# Patient Record
Sex: Female | Born: 1990 | Race: White | Hispanic: No | Marital: Single | Smoking: Former smoker
Health system: Southern US, Community
[De-identification: ages and names within clinical notes are randomized; demographics above are authoritative.]

## PROBLEM LIST (undated history)

## (undated) DIAGNOSIS — R87619 Unspecified abnormal cytological findings in specimens from cervix uteri: Secondary | ICD-10-CM

## (undated) DIAGNOSIS — O26619 Liver and biliary tract disorders in pregnancy, unspecified trimester: Secondary | ICD-10-CM

## (undated) DIAGNOSIS — O2390 Unspecified genitourinary tract infection in pregnancy, unspecified trimester: Secondary | ICD-10-CM

## (undated) DIAGNOSIS — IMO0002 Reserved for concepts with insufficient information to code with codable children: Secondary | ICD-10-CM

## (undated) DIAGNOSIS — O26899 Other specified pregnancy related conditions, unspecified trimester: Secondary | ICD-10-CM

## (undated) DIAGNOSIS — Z8619 Personal history of other infectious and parasitic diseases: Secondary | ICD-10-CM

## (undated) DIAGNOSIS — O358XX0 Maternal care for other (suspected) fetal abnormality and damage, not applicable or unspecified: Secondary | ICD-10-CM

## (undated) HISTORY — DX: Maternal care for other (suspected) fetal abnormality and damage, not applicable or unspecified: O35.8XX0

## (undated) HISTORY — PX: NO PAST SURGERIES: SHX2092

## (undated) HISTORY — DX: Personal history of other infectious and parasitic diseases: Z86.19

## (undated) HISTORY — DX: Reserved for concepts with insufficient information to code with codable children: IMO0002

## (undated) HISTORY — DX: Unspecified genitourinary tract infection in pregnancy, unspecified trimester: O23.90

## (undated) HISTORY — DX: Other specified pregnancy related conditions, unspecified trimester: O26.899

## (undated) HISTORY — DX: Liver and biliary tract disorders in pregnancy, unspecified trimester: O26.619

## (undated) HISTORY — DX: Unspecified abnormal cytological findings in specimens from cervix uteri: R87.619

---
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
## 2010-05-08 ENCOUNTER — Other Ambulatory Visit: Payer: Self-pay | Admitting: Family Medicine

## 2010-05-09 ENCOUNTER — Ambulatory Visit
Admission: RE | Admit: 2010-05-09 | Discharge: 2010-05-09 | Disposition: A | Discharge status: Home / Self Care | Payer: Federal, State, Local not specified - PPO | Source: Ambulatory Visit | Attending: Family Medicine | Admitting: Family Medicine

## 2011-07-21 ENCOUNTER — Encounter (HOSPITAL_COMMUNITY): Payer: Self-pay | Admitting: *Deleted

## 2011-07-21 ENCOUNTER — Emergency Department (HOSPITAL_COMMUNITY)
Admission: EM | Admit: 2011-07-21 | Discharge: 2011-07-21 | Disposition: A | Discharge status: Home / Self Care | Payer: Federal, State, Local not specified - PPO | Source: Home / Self Care | Attending: Family Medicine | Admitting: Family Medicine

## 2011-07-21 DIAGNOSIS — J020 Streptococcal pharyngitis: Secondary | ICD-10-CM

## 2011-07-21 LAB — POCT RAPID STREP A: Streptococcus, Group A Screen (Direct): POSITIVE — AB

## 2011-07-21 MED ORDER — IBUPROFEN 600 MG PO TABS: 600.0000 mg | ORAL_TABLET | Freq: Three times a day (TID) | ORAL | Status: AC | PRN

## 2011-07-21 MED ORDER — HYDROCODONE-ACETAMINOPHEN 7.5-325 MG/15ML PO SOLN: 15.0000 mL | Freq: Four times a day (QID) | ORAL | Status: AC | PRN

## 2011-07-21 MED ORDER — PENICILLIN G BENZATHINE 1200000 UNIT/2ML IM SUSP
1.2000 10*6.[IU] | Freq: Once | INTRAMUSCULAR | Status: AC
  Administered 2011-07-21: 1.2 10*6.[IU] via INTRAMUSCULAR

## 2011-07-21 MED ORDER — PENICILLIN G BENZATHINE 1200000 UNIT/2ML IM SUSP
INTRAMUSCULAR | Status: AC
  Filled 2011-07-21: qty 2

## 2011-07-21 NOTE — Discharge Instructions (Signed)

## 2011-07-21 NOTE — ED Provider Notes (Signed)
History     CSN: [id number]  Arrival date & time 07/21/11  1703   First MD Initiated Contact with Patient 07/21/11 1749      Chief Complaint  Patient presents with  . Sore Throat    (Consider location/radiation/quality/duration/timing/severity/associated sxs/prior treatment) HPI Comments: 21 year old female smoker otherwise no significant past medical history. Here complaining of sore throat headache and body aches since yesterday. Pain with swallowing but able to eat solids and drink fluids. Has had chills and subjective fever. Last time she took NyQuil was 6 hours ago. No abdominal pain nausea vomiting or diarrhea. No skin rash.   History reviewed. No pertinent past medical history.  History reviewed. No pertinent past surgical history.  History reviewed. No pertinent family history.  History  Substance Use Topics  . Smoking status: Current Everyday Smoker  . Smokeless tobacco: Not on file  . Alcohol Use: Yes    OB History    Grav Para Term Preterm Abortions TAB SAB Ect Mult Living                  Review of Systems  Constitutional: Positive for fever and chills. Negative for fatigue.  HENT: Positive for sore throat. Negative for rhinorrhea, sneezing, neck pain and neck stiffness.   Respiratory: Negative for cough, shortness of breath and wheezing.   Gastrointestinal: Negative for nausea, vomiting, abdominal pain and diarrhea.  Genitourinary: Negative for dysuria, hematuria and flank pain.  Musculoskeletal: Positive for myalgias.  Skin: Negative for rash.  Neurological: Positive for headaches.    Allergies  Review of patient's allergies indicates no known allergies.  Home Medications   Current Outpatient Rx  Name Route Sig Dispense Refill  . HYDROCODONE-ACETAMINOPHEN 7.5-325 MG/15ML PO SOLN Oral Take 15 mLs by mouth every 6 (six) hours as needed for pain. 120 mL 0  . IBUPROFEN 600 MG PO TABS Oral Take 1 tablet (600 mg total) by mouth every 8 (eight) hours as  needed for pain or fever. 20 tablet 0    BP 113/61  Pulse 115  Temp 99.6 F (37.6 C) (Oral)  Resp 20  SpO2 100%  LMP 07/03/2011  Physical Exam  Nursing note and vitals reviewed. Constitutional: She is oriented to person, place, and time. She appears well-developed and well-nourished. No distress.  HENT:  Head: Normocephalic and atraumatic.       Nasal Congestion with erythema and swelling of nasal turbinates, clear rhinorrhea. Significant pharyngeal erythema no exudates. No uvula deviation. No trismus. TM's with increased vascular markings and some dullness bilaterally no swelling or bulging   Eyes: Conjunctivae and EOM are normal. Pupils are equal, round, and reactive to light. No scleral icterus.  Neck: Normal range of motion. Neck supple.  Cardiovascular: Normal rate, regular rhythm and normal heart sounds.   Pulmonary/Chest: Effort normal and breath sounds normal. No respiratory distress. She has no wheezes. She has no rales.  Abdominal: Soft. There is no tenderness.       No hepatosplenomegaly.   Lymphadenopathy:    She has cervical adenopathy.  Neurological: She is alert and oriented to person, place, and time.  Skin: No rash noted.    ED Course  Procedures (including critical care time)  Labs Reviewed  POCT RAPID STREP A ([facility] CARE ONLY) - Abnormal; Notable for the following:    Streptococcus, Group A Screen (Direct) POSITIVE (*)     All other components within normal limits   No results found.   1. Strep pharyngitis  MDM  Positive rapid strep test. Treated with Bicillin 1.2 million IM x1. Prescribe ibuprofen, hydrocodone with acetaminophen when necessary for pain or fever. Supportive care instructions provided as well Asked to return if worsening or persistent symptoms despite following treatment.        Sharin Grave, MD 07/22/11 1420

## 2011-07-21 NOTE — ED Notes (Signed)
POCT: UA & UPREG - both never actually run. Blase Mess, RT incidently clicked on/completed the orders, assigning this Clinical research associate, as the Research scientist (medical). The tests were cancelled by Alfonse Ras, MD - she placed the orders in error.

## 2011-07-21 NOTE — ED Notes (Signed)
Pt  Reports  Symptoms  Of  sorethroat  Pain  When he she  Swallows  As  Well  As  Some  Body  Aches  As  Well      Pt  Reports  The  Symptoms  Started  Yesterday

## 2011-08-14 ENCOUNTER — Ambulatory Visit
Admission: RE | Admit: 2011-08-14 | Discharge: 2011-08-14 | Disposition: A | Discharge status: Home / Self Care | Payer: Federal, State, Local not specified - PPO | Source: Ambulatory Visit | Attending: *Deleted | Admitting: *Deleted

## 2011-08-14 ENCOUNTER — Other Ambulatory Visit: Payer: Self-pay | Admitting: *Deleted

## 2011-08-14 DIAGNOSIS — W19XXXA Unspecified fall, initial encounter: Secondary | ICD-10-CM

## 2012-01-27 NOTE — L&D Delivery Note (Signed)
  Delivery Note  First Stage:  Induction with cervical balloon - placed @ 0800 Labor onset: 1324 with AROM Augmentation : AROM @ 1324  With clear fluid Analgesia /Anesthesia intrapartum: none  Labored in tub for majority of labor / movement in and out of tub per patient discretion FHR category 1 throughout labor  Second Stage: Exited tub 30 minutes around 1700 per patient discretion Complete dilation at 1732 Onset of pushing at 1735 FHR second stage category 1  Delivery of a viable female at 53 by CNM in LOA position no nuchal cord Cord double clamped after cessation of pulsation, cut by FOB Cord blood sample collected   Third Stage: Placenta delivered [facility] intact with 3 VC @ 1807 Placenta disposition: for disposal Uterine tone firm / bleeding brisk - identified bleeding large vaginal vessel  2nd laceration identified  Anesthesia for repair: 1% xylocaine Repair 3-0 vicryl for hemostasis to bleeding vessel / deep perineal muscle repair with 3-0 vicryl  / 4-0 subcuticular closure Est. Blood Loss (mL):  Complications: none  Mom to postpartum.  Baby to Mom for bonding.  Newborn: Birth Weight: 7 pounds 11 ounces Apgar Scores: 9 - 9 Feeding planned: breast  Marlinda Mike CNM, MSN, [facility] 09/17/2012, 6:31 PM

## 2012-03-15 LAB — OB RESULTS CONSOLE RUBELLA ANTIBODY, IGM: Rubella: IMMUNE

## 2012-03-15 LAB — OB RESULTS CONSOLE GC/CHLAMYDIA
Chlamydia: NEGATIVE
Gonorrhea: NEGATIVE

## 2012-03-15 LAB — OB RESULTS CONSOLE HEPATITIS B SURFACE ANTIGEN: Hepatitis B Surface Ag: NEGATIVE

## 2012-03-15 LAB — OB RESULTS CONSOLE RPR: RPR: NONREACTIVE

## 2012-03-15 LAB — OB RESULTS CONSOLE ABO/RH: RH Type: POSITIVE

## 2012-03-15 LAB — OB RESULTS CONSOLE ANTIBODY SCREEN: Antibody Screen: NEGATIVE

## 2012-03-15 LAB — OB RESULTS CONSOLE HIV ANTIBODY (ROUTINE TESTING): HIV: NONREACTIVE

## 2012-08-01 IMAGING — CR DG LUMBAR SPINE COMPLETE 4+V
5 series · 5 of 5 positions shown · non-contrast
Comparison: None.

CLINICAL DATA: Fell several days ago with pain in the lower back

LUMBAR SPINE - COMPLETE 4+ VIEW

[t l-spine a.p.]
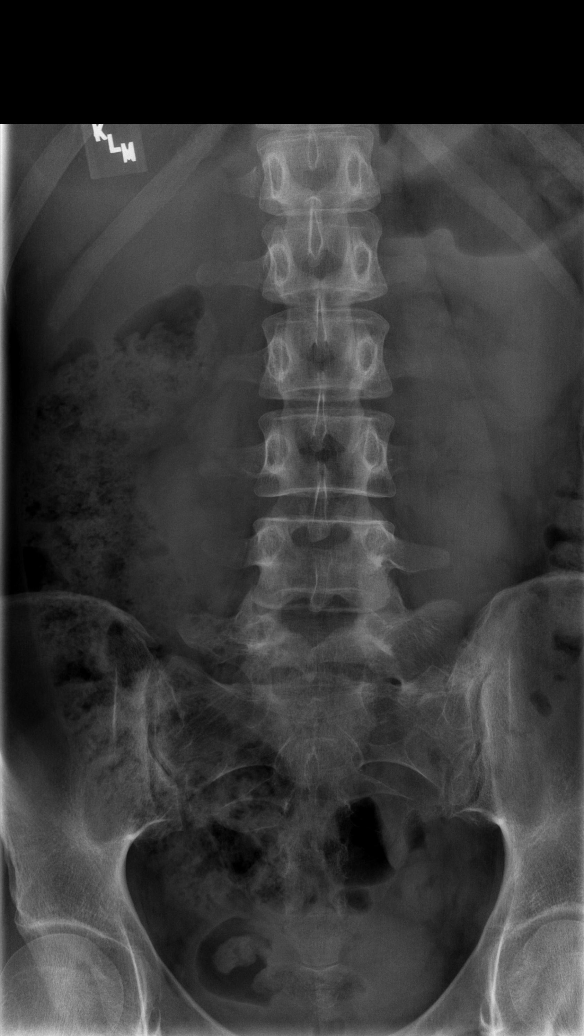

[t l-spine oblique exposure (1 of 2)]
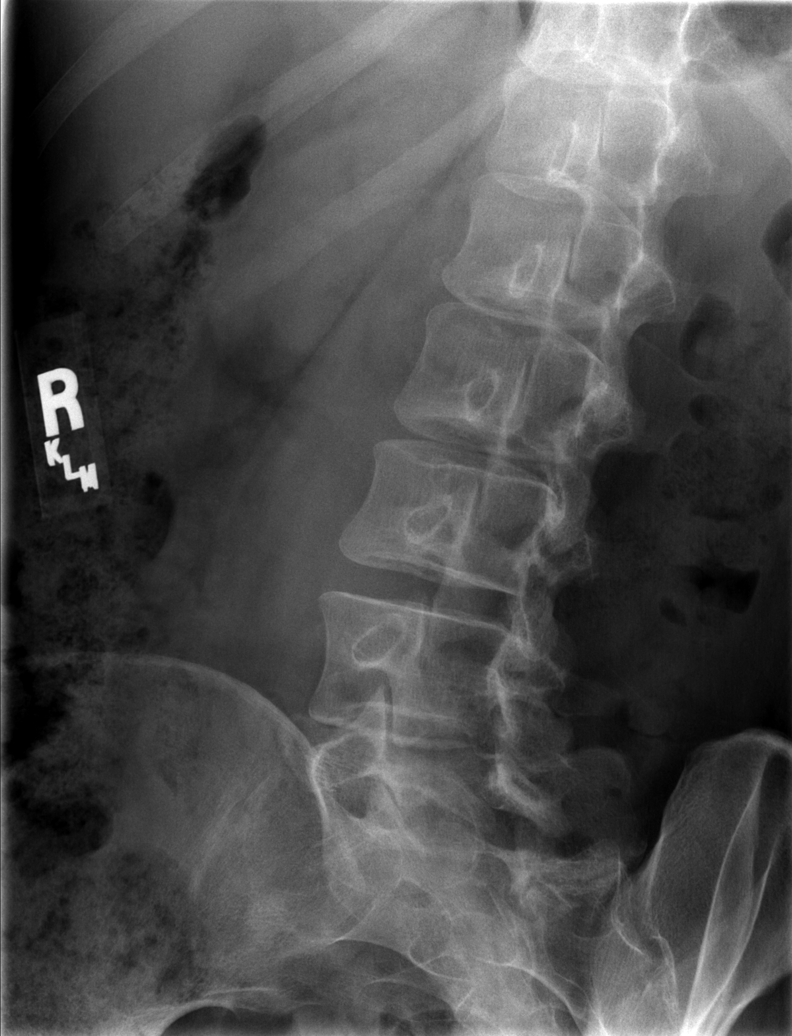

[t l-spine oblique exposure (2 of 2)]
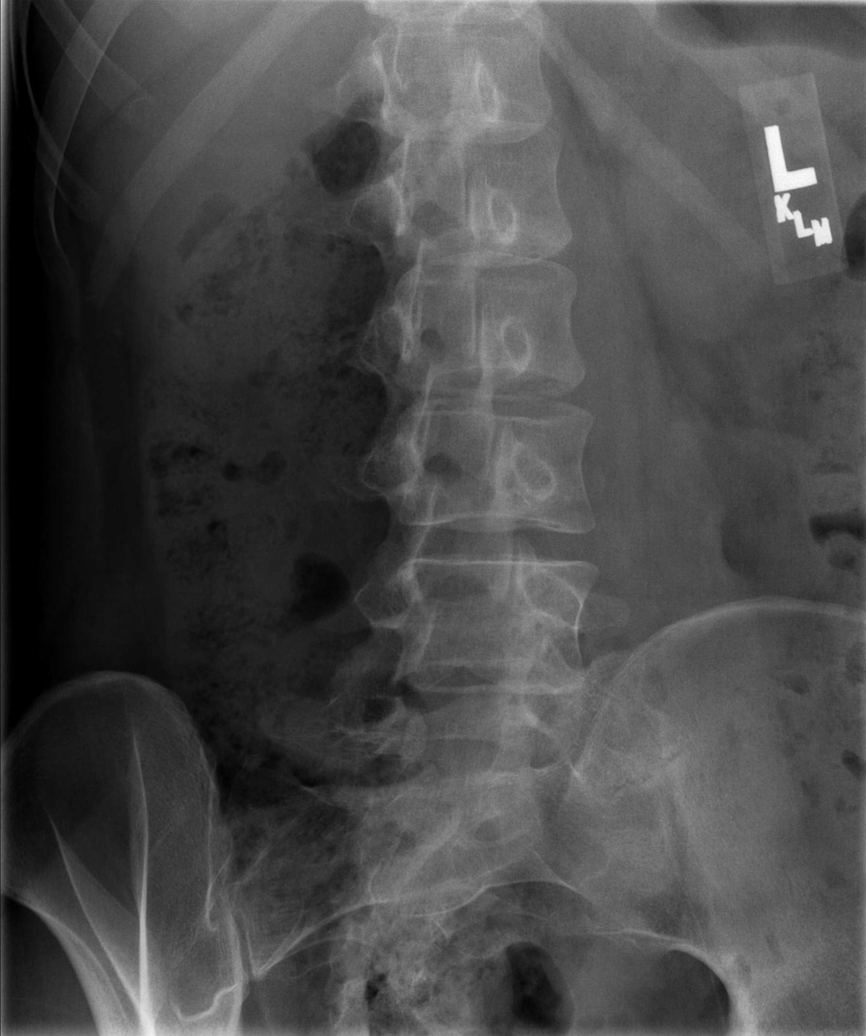

[t l-spine lat]
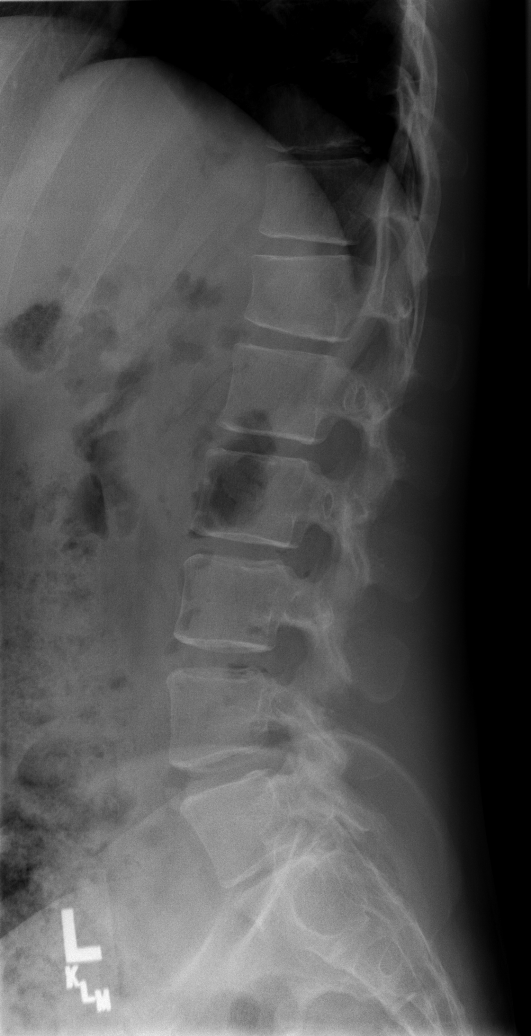

[t l-spine l5-s1 spot]
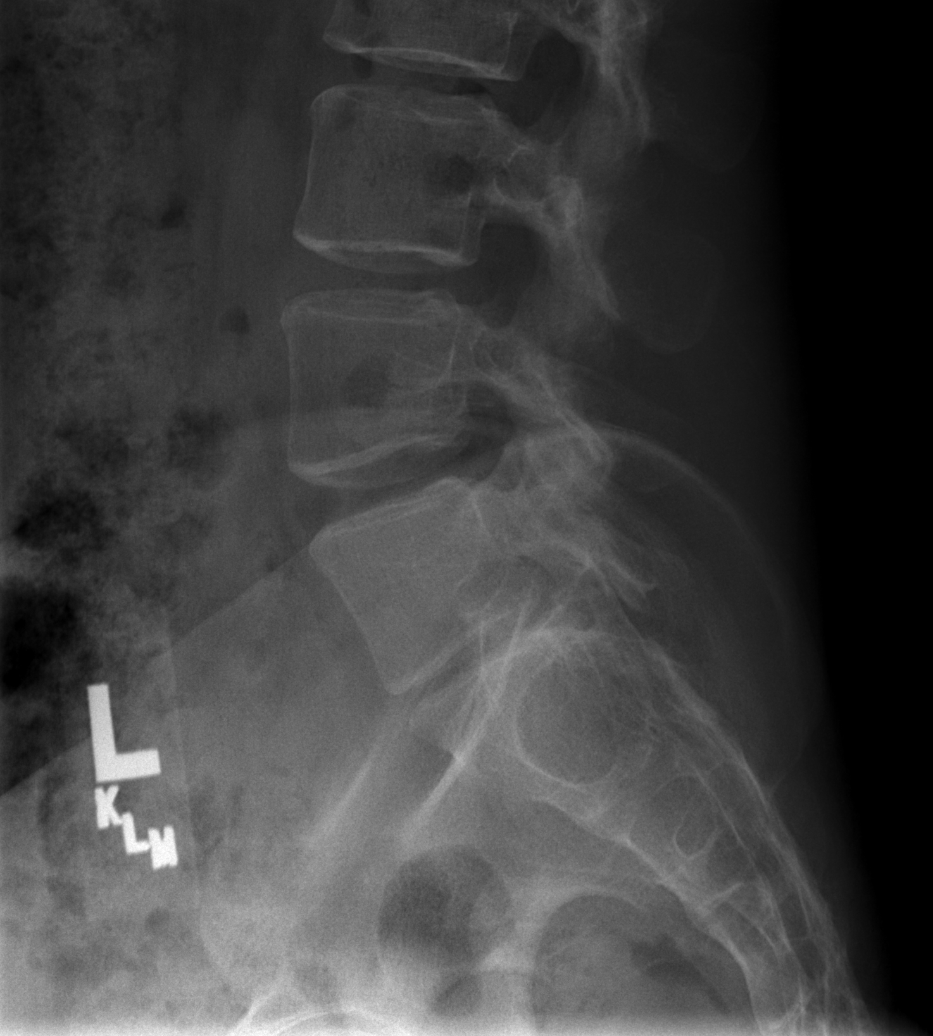

[5 of 5 positions shown; findings below may reference images not displayed]

FINDINGS: The lumbar vertebrae are in normal alignment.  There
appear to be sixth non-rib bearing lumbar vertebrae.
Intervertebral disc spaces appear normal.  There is very minimal
anterior wedging of the anterosuperior aspect of L1 vertebral body
which could represent very minimal compression deformity. No
retropulsion is seen.  The SI joints appear normal.
IMPRESSION: Six non-rib bearing lumbar vertebrae.  Question of mild anterior
wedge compression of L1.

## 2012-08-26 LAB — OB RESULTS CONSOLE GBS: GBS: NEGATIVE

## 2012-09-15 ENCOUNTER — Telehealth (HOSPITAL_COMMUNITY): Payer: Self-pay | Admitting: *Deleted

## 2012-09-15 ENCOUNTER — Encounter (HOSPITAL_COMMUNITY): Payer: Self-pay | Admitting: *Deleted

## 2012-09-15 NOTE — Telephone Encounter (Signed)
Preadmission screen  

## 2012-09-17 ENCOUNTER — Encounter (HOSPITAL_COMMUNITY): Payer: Self-pay

## 2012-09-17 ENCOUNTER — Inpatient Hospital Stay (HOSPITAL_COMMUNITY)
Admission: RE | Admit: 2012-09-17 | Discharge: 2012-09-19 | DRG: 373 | Disposition: A | Discharge status: Home / Self Care | Payer: Federal, State, Local not specified - PPO | Source: Ambulatory Visit | Attending: Obstetrics and Gynecology | Admitting: Obstetrics and Gynecology

## 2012-09-17 VITALS — BP 94/60 | HR 72 | Temp 97.5°F | Resp 18 | Ht 67.0 in | Wt 190.0 lb

## 2012-09-17 DIAGNOSIS — K838 Other specified diseases of biliary tract: Secondary | ICD-10-CM | POA: Diagnosis present

## 2012-09-17 DIAGNOSIS — O26649 Intrahepatic cholestasis of pregnancy, unspecified trimester: Secondary | ICD-10-CM | POA: Diagnosis present

## 2012-09-17 DIAGNOSIS — IMO0002 Reserved for concepts with insufficient information to code with codable children: Secondary | ICD-10-CM | POA: Diagnosis present

## 2012-09-17 DIAGNOSIS — O26619 Liver and biliary tract disorders in pregnancy, unspecified trimester: Secondary | ICD-10-CM | POA: Diagnosis present

## 2012-09-17 DIAGNOSIS — K831 Obstruction of bile duct: Secondary | ICD-10-CM | POA: Diagnosis present

## 2012-09-17 LAB — CBC
HCT: 36.4 % (ref 36.0–46.0)
Hemoglobin: 12.3 g/dL (ref 12.0–15.0)
MCH: 28.1 pg (ref 26.0–34.0)
MCHC: 33.8 g/dL (ref 30.0–36.0)
MCV: 83.3 fL (ref 78.0–100.0)
Platelets: 182 10*3/uL (ref 150–400)
RBC: 4.37 MIL/uL (ref 3.87–5.11)
RDW: 13.7 % (ref 11.5–15.5)
WBC: 14.4 10*3/uL — ABNORMAL HIGH (ref 4.0–10.5)

## 2012-09-17 LAB — COMPREHENSIVE METABOLIC PANEL
ALT: 25 U/L (ref 0–35)
AST: 24 U/L (ref 0–37)
Albumin: 2.9 g/dL — ABNORMAL LOW (ref 3.5–5.2)
Alkaline Phosphatase: 180 U/L — ABNORMAL HIGH (ref 39–117)
BUN: 9 mg/dL (ref 6–23)
CO2: 18 mEq/L — ABNORMAL LOW (ref 19–32)
Calcium: 9.1 mg/dL (ref 8.4–10.5)
Chloride: 102 mEq/L (ref 96–112)
Creatinine, Ser: 0.49 mg/dL — ABNORMAL LOW (ref 0.50–1.10)
GFR calc Af Amer: >90 mL/min (ref >90)
GFR calc non Af Amer: >90 mL/min (ref >90)
Glucose, Bld: 77 mg/dL (ref 70–99)
Potassium: 3.8 mEq/L (ref 3.5–5.1)
Sodium: 134 mEq/L — ABNORMAL LOW (ref 135–145)
Total Bilirubin: 0.3 mg/dL (ref 0.3–1.2)
Total Protein: 7.1 g/dL (ref 6.0–8.3)

## 2012-09-17 LAB — RPR: RPR Ser Ql: NONREACTIVE

## 2012-09-17 MED ORDER — IBUPROFEN 600 MG PO TABS
600.0000 mg | ORAL_TABLET | Freq: Four times a day (QID) | ORAL | Status: DC
  Administered 2012-09-17 – 2012-09-19 (×7): 600 mg via ORAL
  Filled 2012-09-17 (×7): qty 1

## 2012-09-17 MED ORDER — LIDOCAINE HCL (PF) 1 % IJ SOLN
30.0000 mL | INTRAMUSCULAR | Status: DC | PRN
  Administered 2012-09-17: 30 mL via SUBCUTANEOUS
  Filled 2012-09-17 (×2): qty 30

## 2012-09-17 MED ORDER — OXYCODONE-ACETAMINOPHEN 5-325 MG PO TABS
1.0000 | ORAL_TABLET | ORAL | Status: DC | PRN
  Filled 2012-09-17: qty 1

## 2012-09-17 MED ORDER — DIPHENHYDRAMINE HCL 25 MG PO CAPS: 25.0000 mg | ORAL_CAPSULE | Freq: Four times a day (QID) | ORAL | Status: DC | PRN

## 2012-09-17 MED ORDER — URSODIOL 300 MG PO CAPS
300.0000 mg | ORAL_CAPSULE | Freq: Two times a day (BID) | ORAL | Status: DC
  Administered 2012-09-17 – 2012-09-19 (×4): 300 mg via ORAL
  Filled 2012-09-17 (×6): qty 1

## 2012-09-17 MED ORDER — LACTATED RINGERS IV SOLN: 500.0000 mL | INTRAVENOUS | Status: DC | PRN

## 2012-09-17 MED ORDER — SENNOSIDES-DOCUSATE SODIUM 8.6-50 MG PO TABS
2.0000 | ORAL_TABLET | Freq: Every day | ORAL | Status: DC
  Administered 2012-09-17 – 2012-09-18 (×2): 2 via ORAL

## 2012-09-17 MED ORDER — ACETAMINOPHEN 325 MG PO TABS: 650.0000 mg | ORAL_TABLET | ORAL | Status: DC | PRN

## 2012-09-17 MED ORDER — IBUPROFEN 600 MG PO TABS: 600.0000 mg | ORAL_TABLET | Freq: Four times a day (QID) | ORAL | Status: DC | PRN

## 2012-09-17 MED ORDER — OXYTOCIN 10 UNIT/ML IJ SOLN
10.0000 [IU] | Freq: Once | INTRAMUSCULAR | Status: DC
  Filled 2012-09-17: qty 2

## 2012-09-17 MED ORDER — BENZOCAINE-MENTHOL 20-0.5 % EX AERO
1.0000 "application " | INHALATION_SPRAY | CUTANEOUS | Status: DC | PRN
  Filled 2012-09-17: qty 56

## 2012-09-17 MED ORDER — DIBUCAINE 1 % RE OINT
1.0000 "application " | TOPICAL_OINTMENT | RECTAL | Status: DC | PRN
  Filled 2012-09-17: qty 28

## 2012-09-17 MED ORDER — WITCH HAZEL-GLYCERIN EX PADS: 1.0000 "application " | MEDICATED_PAD | CUTANEOUS | Status: DC | PRN

## 2012-09-17 MED ORDER — CITRIC ACID-SODIUM CITRATE 334-500 MG/5ML PO SOLN: 30.0000 mL | ORAL | Status: DC | PRN

## 2012-09-17 MED ORDER — LANOLIN HYDROUS EX OINT: TOPICAL_OINTMENT | CUTANEOUS | Status: DC | PRN

## 2012-09-17 NOTE — H&P (Signed)
  OB ADMISSION/ HISTORY & PHYSICAL:  Admission Date: 09/17/2012  7:06 AM  Admit Diagnosis: cholestasis of pregnancy / marginal cord insertion  Shallyn Constancio is a 22 y.o. female presenting for induction of labor.  Prenatal History: G1P0   EDC : 09/28/2012, by Other Basis  Prenatal care at [facility]  Primary Ob Provider: Marlinda Mike CNM Prenatal course complicated by UTI / scabies x 2 / abnormal pap / pruritus gravidarum / cholestasis of pregnancy (elevated bile acid 12 / LE 32-35)  Prenatal Labs: ABO, Rh: O/Positive/-- (02/18 0000) Antibody: Negative (02/18 0000) Rubella: Immune (02/18 0000)  RPR: Nonreactive (02/18 0000)  HBsAg: Negative (02/18 0000)  HIV: Non-reactive (02/18 0000)  GTT: negative GBS: Negative (08/01 0000)   Medical / Surgical History :  Past medical history:  Past Medical History  Diagnosis Date  . Abnormal Pap smear   . Infections of genitourinary tract in pregnancy, unspecified as to episode of care(646.60)   . Hx of scabies   . Other known or suspected fetal abnormality, not elsewhere classified, affecting management of mother, antepartum condition or complication     fetal echogenic bowel  . Other specified complication of pregnancy, unspecified as to episode of care     pruritis gravidarum  . Liver and biliary tract disorders in pregnancy, unspecified as to episode of care or not applicable(646.70)   . Marginal insertion of umbilical cord      Past surgical history:  Past Surgical History  Procedure Laterality Date  . No past surgeries      Family History: No family history on file.   Social History:  reports that she quit smoking about 8 months ago. She has never used smokeless tobacco. She reports that she does not drink alcohol or use illicit drugs.  Allergies: Milk-related compounds; Sugar-protein-starch; and Wheat bran   Current Medications at time of admission:  Prenatal vitamin Actigal 300 mg TID  Review of  Systems: Active FM irregular ctx x 24 hours  - not painful No LOF bloody show absent  Physical Exam:  VS: Blood pressure 119/75, pulse 91, temperature 99 F (37.2 C), temperature source Oral, resp. rate 16, height 5\' 7"  (1.702 m), weight 86.183 kg (190 lb).  General: alert and oriented, appears comfortable / NAD or pain Heart: RRR Lungs: Clear lung fields Abdomen: Gravid, soft and non-tender, non-distended / uterus: gravid and non-tender Extremities: no edema  Genitalia / VE:  2-3 / 70% / vtx / -2  FHR: baseline rate 135 / variability moderate / accelerations + / no decelerations TOCO: mild ctx every 3-6 minutes  Assessment: 38.[redacted] weeks gestation Induction of labor - cholestasis and marginal cord insertion FHR category 1  Plan:  Admit Cervical balloon to AROM  / pitocin augmentation Unsure pain management plan at this time - wants water immersion   Dr Cherly Hensen notified of admission / plan of care   Marlinda Mike CNM, MSN, [facility] 09/17/2012, 7:56 AM

## 2012-09-17 NOTE — Progress Notes (Signed)
CNM assisted with setting up birth tub

## 2012-09-17 NOTE — Progress Notes (Signed)
S:  Cramping and regular ctx - not painful      Wants to enter tub for laboring      O:  VS: Blood pressure 120/60, pulse 90, temperature 98.1 F (36.7 C), temperature source Oral, resp. rate 16, height 5\' 7"  (1.702 m), weight 86.183 kg (190 lb).        FHR : baseline 135 / variability moderate / accelerations + / no decelerations        Toco: contractions every 2-3 minutes / mild         Cervix : 6 / 90% / vtx / -1 BBOW        Membranes: AROM - clear        Hx scabies reviewed by [facility] manager- ok if no active infection ( dermatology evaluation last week - no active infection)        Assisted with tub set-up / filling        Water temp 100.3  A: active labor     FHR category 1  P: water immersion with continuous EFM     recheck in 2 hours for progression - if no progression pitocin augmentation     Patient BRP then entered tub at 1345    Marlinda Mike CNM, MSN, [facility] 09/17/2012, 1:50 PM

## 2012-09-17 NOTE — Progress Notes (Signed)
S:  Ctx more uncomfortable - some pressure       + nausea / some loose BM ( in and out of tub for past 1.5 hours)       Out of tub for BRP and repeat cervical check  O:  VS: Blood pressure 134/62, pulse 74, temperature 99 F (37.2 C), temperature source Oral, resp. rate 20, height 5\' 7"  (1.702 m), weight 86.183 kg (190 lb).        FHR : baseline 145 / variability moderate / accelerations + / no decelerations        Toco: contractions every 2 minutes / mild-moderate         Cervix : 8 / 90 / vtx / -1 / ROT        Membranes: clear fluid - Small + show  A: active labor     FHR category 1  P: continue current management - anticipate SVB in next 2 hours          Water drained and replaced in tub - temp 100.1 on re-entry @ 1545    Marlinda Mike CNM, MSN, FACNM 09/17/2012, 3:50 PM

## 2012-09-17 NOTE — Progress Notes (Signed)
S:  comfortable  O:  VS: Blood pressure 119/75, pulse 91, temperature 99 F (37.2 C), temperature source Oral, resp. rate 16, height 5\' 7"  (1.702 m), weight 86.183 kg (190 lb).        FHR : baseline 135 / variability moderaet / accelerations + / no decelerations        Toco: contractions every 2-6 minutes / mild         Cervical balloon placed then 60 ml balloon inflated / procedure tolerated well / secured to leg  A: Induction of labor     FHR category 1  P: discussed IOl options - cervical balloon to AROM then pitocin versus pitocin or combination of two options      Placed cervical balloon since already contracting                                - will reassess ctx pattern after balloon out then decide timing for AROM and pitocin     Marlinda Mike CNM, MSN, [facility] 09/17/2012, 8:09 AM

## 2012-09-18 LAB — COMPREHENSIVE METABOLIC PANEL
ALT: 19 U/L (ref 0–35)
AST: 23 U/L (ref 0–37)
Albumin: 2.4 g/dL — ABNORMAL LOW (ref 3.5–5.2)
Alkaline Phosphatase: 153 U/L — ABNORMAL HIGH (ref 39–117)
BUN: 10 mg/dL (ref 6–23)
CO2: 21 mEq/L (ref 19–32)
Calcium: 9.6 mg/dL (ref 8.4–10.5)
Chloride: 104 mEq/L (ref 96–112)
Creatinine, Ser: 0.54 mg/dL (ref 0.50–1.10)
GFR calc Af Amer: >90 mL/min (ref >90)
GFR calc non Af Amer: >90 mL/min (ref >90)
Glucose, Bld: 92 mg/dL (ref 70–99)
Potassium: 3.6 mEq/L (ref 3.5–5.1)
Sodium: 137 mEq/L (ref 135–145)
Total Bilirubin: 0.3 mg/dL (ref 0.3–1.2)
Total Protein: 5.6 g/dL — ABNORMAL LOW (ref 6.0–8.3)

## 2012-09-18 LAB — CBC
HCT: 31.3 % — ABNORMAL LOW (ref 36.0–46.0)
Hemoglobin: 10.3 g/dL — ABNORMAL LOW (ref 12.0–15.0)
MCH: 27.7 pg (ref 26.0–34.0)
MCHC: 32.9 g/dL (ref 30.0–36.0)
MCV: 84.1 fL (ref 78.0–100.0)
Platelets: 201 10*3/uL (ref 150–400)
RBC: 3.72 MIL/uL — ABNORMAL LOW (ref 3.87–5.11)
RDW: 13.8 % (ref 11.5–15.5)
WBC: 17.2 10*3/uL — ABNORMAL HIGH (ref 4.0–10.5)

## 2012-09-18 LAB — ABO/RH: ABO/RH(D): O POS
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
## 2012-09-18 NOTE — Progress Notes (Signed)
PPD 1 SVD  S:  Reports feeling well - no concerns / happy with birth experience             No itching or epigastric pain             Tolerating po/ No nausea or vomiting             Bleeding is light             Pain controlled with motrin and percocet             Up ad lib / ambulatory / voiding QS  Newborn breast feeding   O:               VS: BP 111/66  Pulse 81  Temp(Src) 98 F (36.7 C) (Oral)  Resp 18  Ht 5\' 7"  (1.702 m)  Wt 86.183 kg (190 lb)  BMI 29.75 kg/m2   LABS:              Recent Labs  09/17/12 0834 09/18/12 0639  WBC 14.4* 17.2*  HGB 12.3 10.3*  PLT 182 201               Blood type: --/--/O POS (08/23 0830)  Rubella: Immune (02/18 0000)    CMP stable / within normal limits  ( SGOT 23 / SGPT 19 )                   I&O: Intake/Output     08/23 0701 - 08/24 0700 08/24 0701 - 08/25 0700   Blood 450    Total Output 450     Net -450                       Physical Exam:             Alert and oriented X3  Abdomen: soft, non-tender, non-distended              Fundus: firm, non-tender, U-2  Perineum: moderate edema / ice pack in place  Lochia: light  Extremities: no edema, no calf pain or tenderness    A: PPD # 1              Cholestasis of pregnancy - delivered  Doing well - stable status  P: Routine post partum orders             Actigall x 2 weeks - repeat labs in office 2 weeks    Marlinda Mike CNM, MSN, [facility] 09/18/2012, 9:00 AM

## 2012-09-19 MED ORDER — URSODIOL 300 MG PO CAPS: 300.0000 mg | ORAL_CAPSULE | Freq: Two times a day (BID) | ORAL | Status: DC

## 2012-09-19 MED ORDER — IBUPROFEN 600 MG PO TABS: 600.0000 mg | ORAL_TABLET | Freq: Four times a day (QID) | ORAL | Status: DC

## 2012-09-19 NOTE — Lactation Note (Addendum)
This note was copied from the chart of Stacey Fraida Veldman. Lactation Consultation Note; Follow up visit with mom. She reports that breast feeding is going better. Nipples are still tender but intact. Rubbing EBM into them after nursing. Reports that breasts are feeling fuller this morning. Lab in to draw blood. No questions at present. Encouraged to call for assist if needed before DC.  Patient Name: Stacey Tate [id number] Date: 09/19/2012 Reason for consult: Follow-up assessment   Maternal Data    Feeding Feeding Type: Breast Milk  LATCH Score/Interventions    Lactation Tools Discussed/Used     Consult Status Consult Status: Complete    Pamelia Hoit 09/19/2012, 9:36 AM

## 2012-09-19 NOTE — Discharge Summary (Signed)
Obstetric Discharge Summary Reason for Admission: onset of labor Prenatal Procedures: ultrasound Intrapartum Procedures: spontaneous vaginal delivery Postpartum Procedures: none Complications-Operative and Postpartum: 2nd degree perineal laceration Hemoglobin  Date Value Range Status  09/18/2012 10.3* 12.0 - 15.0 g/dL Final     HCT  Date Value Range Status  09/18/2012 31.3* 36.0 - 46.0 % Final    Physical Exam:  General: alert, cooperative and no distress Lochia: appropriate Uterine Fundus: firm, midline, U-2 DVT Evaluation: No evidence of DVT seen on physical exam. Negative Homan's sign. No cords or calf tenderness. No significant calf/ankle edema.  Labor Course: spontaneous onset of labor / labored in and out of tub for majority of labor / augmentation with cervical balloon / progressed to complete without further intervention / SVD with vaginal vessel laceration and 2nd degree laceration - hemastatic with repair / baby skin-to-skin with mom  Discharge Diagnoses: Term Pregnancy-delivered and Cholestasis of Pregnancy - delivered  Discharge Information: Date: 09/19/2012 Activity: pelvic rest Diet: routine Medications: Tylenol #3, Ibuprofen and Actigall Condition: stable Instructions: refer to practice specific booklet Discharge to: home Follow-up Information   Follow up with Marlinda Mike, CNM. Schedule an appointment as soon as possible for a visit in 6 weeks. (Return to [facility] in 2 weeks for repeat lab work)    Specialty:  Obstetrics and Gynecology   Contact information:   [address] [place] [address] [phone]       Newborn Data: Live born female  Birth Weight: 7 lb 11.3 oz (3496 g) APGAR: 9, 9  Home with mother.  Kenard Gower, MSN, CNM 09/19/2012, 11:58 AM

## 2012-09-19 NOTE — Progress Notes (Signed)
Patient ID: Stacey Tate, female   DOB: [birth year], 22 y.o.   MRN: [id number] Post Partum Day #2            Information for the patient's newborn:  Stacey, Blackston Girl Tate [[id number]]  female  Feeding: breast  Subjective: No HA, SOB, CP, F/C, breast symptoms. Pain well-controlled with ibuprofen. Normal vaginal bleeding, no clots.      Objective:  Temp:  [97.5 F (36.4 C)-98.4 F (36.9 C)] 97.5 F (36.4 C) (08/25 0645) Pulse Rate:  [72-76] 72 (08/25 0645) Resp:  [18] 18 (08/25 0645) BP: (94-97)/(58-60) 94/60 mmHg (08/25 0645)   Blood type: O POS (08/23 0830) Rubella: Immune (02/18 0000)    Physical Exam:  General: alert, cooperative and no distress Uterine Fundus: firm, midline, U-2 Lochia: appropriate Perineum: 2nd degree repair healing well, no edema  DVT Evaluation: No evidence of DVT seen on physical exam. Negative Homan's sign. No cords or calf tenderness. No significant calf/ankle edema.    Assessment/Plan: PPD # 2 / 22 y.o., G1P1001 S/P: spontaneous vaginal   Active Problems:   Cholestasis of pregnancy   Marginal insertion of umbilical cord   Postpartum care following vaginal delivery (8/23)   Normal postpartum exam  Continue current postpartum care  Ibuprofen prn while at home  Actigall x 2 weeks - repeat liver enzymes in 2 weeks  D/C home   LOS: 2 days   Stacey Tate, M, MSN, CNM 09/19/2012, 11:48 AM

## 2013-11-27 ENCOUNTER — Encounter (HOSPITAL_COMMUNITY): Payer: Self-pay

## 2015-06-07 DIAGNOSIS — Z113 Encounter for screening for infections with a predominantly sexual mode of transmission: Secondary | ICD-10-CM | POA: Diagnosis not present

## 2015-06-07 DIAGNOSIS — Z6824 Body mass index (BMI) 24.0-24.9, adult: Secondary | ICD-10-CM | POA: Diagnosis not present

## 2015-06-07 DIAGNOSIS — R87612 Low grade squamous intraepithelial lesion on cytologic smear of cervix (LGSIL): Secondary | ICD-10-CM | POA: Diagnosis not present

## 2015-06-07 DIAGNOSIS — Z01419 Encounter for gynecological examination (general) (routine) without abnormal findings: Secondary | ICD-10-CM | POA: Diagnosis not present

## 2015-11-15 DIAGNOSIS — F411 Generalized anxiety disorder: Secondary | ICD-10-CM | POA: Diagnosis not present

## 2015-11-21 DIAGNOSIS — F411 Generalized anxiety disorder: Secondary | ICD-10-CM | POA: Diagnosis not present

## 2015-12-02 DIAGNOSIS — F411 Generalized anxiety disorder: Secondary | ICD-10-CM | POA: Diagnosis not present

## 2015-12-09 DIAGNOSIS — F411 Generalized anxiety disorder: Secondary | ICD-10-CM | POA: Diagnosis not present

## 2015-12-16 DIAGNOSIS — F411 Generalized anxiety disorder: Secondary | ICD-10-CM | POA: Diagnosis not present

## 2015-12-24 DIAGNOSIS — F411 Generalized anxiety disorder: Secondary | ICD-10-CM | POA: Diagnosis not present

## 2015-12-30 DIAGNOSIS — F411 Generalized anxiety disorder: Secondary | ICD-10-CM | POA: Diagnosis not present

## 2016-01-06 DIAGNOSIS — F411 Generalized anxiety disorder: Secondary | ICD-10-CM | POA: Diagnosis not present

## 2016-01-13 DIAGNOSIS — F411 Generalized anxiety disorder: Secondary | ICD-10-CM | POA: Diagnosis not present

## 2016-01-24 DIAGNOSIS — F411 Generalized anxiety disorder: Secondary | ICD-10-CM | POA: Diagnosis not present

## 2016-01-27 NOTE — L&D Delivery Note (Signed)
Delivery Note AROM at 1212 clear AF, 9/100/-1  Patient returned to water tub for transition labor   12:34 PM a viable female was delivered via Vaginal, Spontaneous (Presentation: OA to ROT; under water, shoulder released after 30 seconds with following contraction, tight nuchal cord unwound under water, baby brought to surface by mother, vigorous, spontaneous cry ).  APGAR: 8, 9; weight pending .  Mother transferred to bed after cord cut by FOB.  Placenta status: Stacey Tate, S/C/I, eccentric cord insertion .  Cord:  CAN x 1, tight, 3VC  Cord blood collected for typing.  Anesthesia:  Local Hydrotherapy Episiotomy: None Lacerations: 2nd degree Suture Repair: 3.0 vicryl Est. Blood Loss (mL): 400  Mom to postpartum.  Baby "Stacey Tate" to Couplet care / Skin to Skin.  Neta MendsDaniela C Paul, CNM 01/03/2017, 1:15 PM

## 2016-02-03 DIAGNOSIS — F411 Generalized anxiety disorder: Secondary | ICD-10-CM | POA: Diagnosis not present

## 2016-02-10 DIAGNOSIS — F411 Generalized anxiety disorder: Secondary | ICD-10-CM | POA: Diagnosis not present

## 2016-02-17 DIAGNOSIS — F411 Generalized anxiety disorder: Secondary | ICD-10-CM | POA: Diagnosis not present

## 2016-02-24 DIAGNOSIS — F411 Generalized anxiety disorder: Secondary | ICD-10-CM | POA: Diagnosis not present

## 2016-03-07 DIAGNOSIS — J111 Influenza due to unidentified influenza virus with other respiratory manifestations: Secondary | ICD-10-CM | POA: Diagnosis not present

## 2016-03-09 DIAGNOSIS — F411 Generalized anxiety disorder: Secondary | ICD-10-CM | POA: Diagnosis not present

## 2016-03-13 DIAGNOSIS — M24852 Other specific joint derangements of left hip, not elsewhere classified: Secondary | ICD-10-CM | POA: Diagnosis not present

## 2016-03-13 DIAGNOSIS — M549 Dorsalgia, unspecified: Secondary | ICD-10-CM | POA: Diagnosis not present

## 2016-03-13 DIAGNOSIS — M5432 Sciatica, left side: Secondary | ICD-10-CM | POA: Diagnosis not present

## 2016-03-13 DIAGNOSIS — M25552 Pain in left hip: Secondary | ICD-10-CM | POA: Diagnosis not present

## 2016-03-18 ENCOUNTER — Other Ambulatory Visit: Payer: Self-pay | Admitting: Orthopedic Surgery

## 2016-03-18 DIAGNOSIS — M25552 Pain in left hip: Secondary | ICD-10-CM

## 2016-03-25 ENCOUNTER — Ambulatory Visit
Admission: RE | Admit: 2016-03-25 | Discharge: 2016-03-25 | Disposition: A | Discharge status: Home / Self Care | Payer: Federal, State, Local not specified - PPO | Source: Ambulatory Visit | Attending: Orthopedic Surgery | Admitting: Orthopedic Surgery

## 2016-03-25 DIAGNOSIS — M25552 Pain in left hip: Secondary | ICD-10-CM | POA: Diagnosis not present

## 2016-03-27 DIAGNOSIS — M7062 Trochanteric bursitis, left hip: Secondary | ICD-10-CM | POA: Diagnosis not present

## 2016-03-27 DIAGNOSIS — M24852 Other specific joint derangements of left hip, not elsewhere classified: Secondary | ICD-10-CM | POA: Diagnosis not present

## 2016-04-13 DIAGNOSIS — F411 Generalized anxiety disorder: Secondary | ICD-10-CM | POA: Diagnosis not present

## 2016-05-04 DIAGNOSIS — F411 Generalized anxiety disorder: Secondary | ICD-10-CM | POA: Diagnosis not present

## 2016-05-11 DIAGNOSIS — Z3201 Encounter for pregnancy test, result positive: Secondary | ICD-10-CM | POA: Diagnosis not present

## 2016-05-11 DIAGNOSIS — Z3687 Encounter for antenatal screening for uncertain dates: Secondary | ICD-10-CM | POA: Diagnosis not present

## 2016-05-17 DIAGNOSIS — J029 Acute pharyngitis, unspecified: Secondary | ICD-10-CM | POA: Diagnosis not present

## 2016-05-23 DIAGNOSIS — J209 Acute bronchitis, unspecified: Secondary | ICD-10-CM | POA: Diagnosis not present

## 2016-06-04 DIAGNOSIS — Z3201 Encounter for pregnancy test, result positive: Secondary | ICD-10-CM | POA: Diagnosis not present

## 2016-06-11 DIAGNOSIS — Z3481 Encounter for supervision of other normal pregnancy, first trimester: Secondary | ICD-10-CM | POA: Diagnosis not present

## 2016-06-11 DIAGNOSIS — Z3689 Encounter for other specified antenatal screening: Secondary | ICD-10-CM | POA: Diagnosis not present

## 2016-06-11 DIAGNOSIS — Z118 Encounter for screening for other infectious and parasitic diseases: Secondary | ICD-10-CM | POA: Diagnosis not present

## 2016-07-06 DIAGNOSIS — Z3482 Encounter for supervision of other normal pregnancy, second trimester: Secondary | ICD-10-CM | POA: Diagnosis not present

## 2016-08-12 DIAGNOSIS — Z3482 Encounter for supervision of other normal pregnancy, second trimester: Secondary | ICD-10-CM | POA: Diagnosis not present

## 2016-08-12 DIAGNOSIS — Z363 Encounter for antenatal screening for malformations: Secondary | ICD-10-CM | POA: Diagnosis not present

## 2016-08-20 DIAGNOSIS — J069 Acute upper respiratory infection, unspecified: Secondary | ICD-10-CM | POA: Diagnosis not present

## 2016-08-20 DIAGNOSIS — J029 Acute pharyngitis, unspecified: Secondary | ICD-10-CM | POA: Diagnosis not present

## 2016-08-24 DIAGNOSIS — K080 Exfoliation of teeth due to systemic causes: Secondary | ICD-10-CM | POA: Diagnosis not present

## 2016-09-11 DIAGNOSIS — Z3482 Encounter for supervision of other normal pregnancy, second trimester: Secondary | ICD-10-CM | POA: Diagnosis not present

## 2016-09-11 DIAGNOSIS — Z362 Encounter for other antenatal screening follow-up: Secondary | ICD-10-CM | POA: Diagnosis not present

## 2016-10-07 DIAGNOSIS — Z3483 Encounter for supervision of other normal pregnancy, third trimester: Secondary | ICD-10-CM | POA: Diagnosis not present

## 2016-10-29 DIAGNOSIS — Z23 Encounter for immunization: Secondary | ICD-10-CM | POA: Diagnosis not present

## 2016-10-29 DIAGNOSIS — Z3689 Encounter for other specified antenatal screening: Secondary | ICD-10-CM | POA: Diagnosis not present

## 2016-10-29 DIAGNOSIS — Z3483 Encounter for supervision of other normal pregnancy, third trimester: Secondary | ICD-10-CM | POA: Diagnosis not present

## 2016-11-13 DIAGNOSIS — Z3483 Encounter for supervision of other normal pregnancy, third trimester: Secondary | ICD-10-CM | POA: Diagnosis not present

## 2016-11-23 DIAGNOSIS — Z3483 Encounter for supervision of other normal pregnancy, third trimester: Secondary | ICD-10-CM | POA: Diagnosis not present

## 2016-12-07 DIAGNOSIS — Z3483 Encounter for supervision of other normal pregnancy, third trimester: Secondary | ICD-10-CM | POA: Diagnosis not present

## 2016-12-07 DIAGNOSIS — Z3685 Encounter for antenatal screening for Streptococcus B: Secondary | ICD-10-CM | POA: Diagnosis not present

## 2016-12-18 DIAGNOSIS — Z3483 Encounter for supervision of other normal pregnancy, third trimester: Secondary | ICD-10-CM | POA: Diagnosis not present

## 2016-12-25 DIAGNOSIS — Z3483 Encounter for supervision of other normal pregnancy, third trimester: Secondary | ICD-10-CM | POA: Diagnosis not present

## 2016-12-31 DIAGNOSIS — Z3483 Encounter for supervision of other normal pregnancy, third trimester: Secondary | ICD-10-CM | POA: Diagnosis not present

## 2017-01-03 ENCOUNTER — Other Ambulatory Visit: Payer: Self-pay

## 2017-01-03 ENCOUNTER — Encounter (HOSPITAL_COMMUNITY): Payer: Self-pay | Admitting: *Deleted

## 2017-01-03 ENCOUNTER — Inpatient Hospital Stay (HOSPITAL_COMMUNITY)
Admission: AD | Admit: 2017-01-03 | Discharge: 2017-01-05 | DRG: 806 | Disposition: A | Discharge status: Home / Self Care | Payer: Medicaid Other | Source: Ambulatory Visit

## 2017-01-03 DIAGNOSIS — O9081 Anemia of the puerperium: Secondary | ICD-10-CM | POA: Diagnosis not present

## 2017-01-03 DIAGNOSIS — Z87891 Personal history of nicotine dependence: Secondary | ICD-10-CM | POA: Diagnosis not present

## 2017-01-03 DIAGNOSIS — Z3483 Encounter for supervision of other normal pregnancy, third trimester: Secondary | ICD-10-CM | POA: Diagnosis present

## 2017-01-03 DIAGNOSIS — D62 Acute posthemorrhagic anemia: Secondary | ICD-10-CM | POA: Diagnosis not present

## 2017-01-03 DIAGNOSIS — Z3A40 40 weeks gestation of pregnancy: Secondary | ICD-10-CM | POA: Diagnosis not present

## 2017-01-03 DIAGNOSIS — Z3A39 39 weeks gestation of pregnancy: Secondary | ICD-10-CM | POA: Diagnosis not present

## 2017-01-03 DIAGNOSIS — K219 Gastro-esophageal reflux disease without esophagitis: Secondary | ICD-10-CM | POA: Diagnosis present

## 2017-01-03 DIAGNOSIS — O9962 Diseases of the digestive system complicating childbirth: Secondary | ICD-10-CM | POA: Diagnosis present

## 2017-01-03 LAB — RPR: RPR Ser Ql: NONREACTIVE

## 2017-01-03 LAB — CBC
HCT: 38.4 % (ref 36.0–46.0)
Hemoglobin: 12.5 g/dL (ref 12.0–15.0)
MCH: 28 pg (ref 26.0–34.0)
MCHC: 32.6 g/dL (ref 30.0–36.0)
MCV: 85.9 fL (ref 78.0–100.0)
Platelets: 202 10*3/uL (ref 150–400)
RBC: 4.47 MIL/uL (ref 3.87–5.11)
RDW: 14 % (ref 11.5–15.5)
WBC: 12.4 10*3/uL — ABNORMAL HIGH (ref 4.0–10.5)

## 2017-01-03 LAB — TYPE AND SCREEN
ABO/RH(D): O POS
Antibody Screen: NEGATIVE

## 2017-01-03 MED ORDER — ACETAMINOPHEN 325 MG PO TABS: 650.0000 mg | ORAL_TABLET | ORAL | Status: DC | PRN

## 2017-01-03 MED ORDER — TETANUS-DIPHTH-ACELL PERTUSSIS 5-2.5-18.5 LF-MCG/0.5 IM SUSP
0.5000 mL | Freq: Once | INTRAMUSCULAR | Status: AC
  Administered 2017-01-05: 0.5 mL via INTRAMUSCULAR

## 2017-01-03 MED ORDER — DIPHENHYDRAMINE HCL 25 MG PO CAPS: 25.0000 mg | ORAL_CAPSULE | Freq: Four times a day (QID) | ORAL | Status: DC | PRN

## 2017-01-03 MED ORDER — FLEET ENEMA 7-19 GM/118ML RE ENEM: 1.0000 | ENEMA | Freq: Every day | RECTAL | Status: DC | PRN

## 2017-01-03 MED ORDER — SIMETHICONE 80 MG PO CHEW: 80.0000 mg | CHEWABLE_TABLET | ORAL | Status: DC | PRN

## 2017-01-03 MED ORDER — ONDANSETRON HCL 4 MG/2ML IJ SOLN: 4.0000 mg | Freq: Four times a day (QID) | INTRAMUSCULAR | Status: DC | PRN

## 2017-01-03 MED ORDER — OXYCODONE-ACETAMINOPHEN 5-325 MG PO TABS: 2.0000 | ORAL_TABLET | ORAL | Status: DC | PRN

## 2017-01-03 MED ORDER — COCONUT OIL OIL
1.0000 "application " | TOPICAL_OIL | Status: DC | PRN
  Administered 2017-01-05: 1 via TOPICAL
  Filled 2017-01-03: qty 120

## 2017-01-03 MED ORDER — OXYTOCIN BOLUS FROM INFUSION: 500.0000 mL | Freq: Once | INTRAVENOUS | Status: DC

## 2017-01-03 MED ORDER — SOD CITRATE-CITRIC ACID 500-334 MG/5ML PO SOLN: 30.0000 mL | ORAL | Status: DC | PRN

## 2017-01-03 MED ORDER — LACTATED RINGERS IV SOLN: INTRAVENOUS | Status: DC

## 2017-01-03 MED ORDER — BENZOCAINE-MENTHOL 20-0.5 % EX AERO
1.0000 "application " | INHALATION_SPRAY | CUTANEOUS | Status: DC | PRN
  Administered 2017-01-03: 1 via TOPICAL
  Filled 2017-01-03: qty 56

## 2017-01-03 MED ORDER — LIDOCAINE HCL (PF) 1 % IJ SOLN
30.0000 mL | INTRAMUSCULAR | Status: AC | PRN
  Administered 2017-01-03: 30 mL via SUBCUTANEOUS
  Filled 2017-01-03: qty 30

## 2017-01-03 MED ORDER — DIBUCAINE 1 % RE OINT: 1.0000 "application " | TOPICAL_OINTMENT | RECTAL | Status: DC | PRN

## 2017-01-03 MED ORDER — WITCH HAZEL-GLYCERIN EX PADS: 1.0000 "application " | MEDICATED_PAD | CUTANEOUS | Status: DC | PRN

## 2017-01-03 MED ORDER — OXYCODONE-ACETAMINOPHEN 5-325 MG PO TABS: 1.0000 | ORAL_TABLET | ORAL | Status: DC | PRN

## 2017-01-03 MED ORDER — ZOLPIDEM TARTRATE 5 MG PO TABS: 5.0000 mg | ORAL_TABLET | Freq: Every evening | ORAL | Status: DC | PRN

## 2017-01-03 MED ORDER — PRENATAL MULTIVITAMIN CH
1.0000 | ORAL_TABLET | Freq: Every day | ORAL | Status: DC
  Filled 2017-01-03 (×2): qty 1

## 2017-01-03 MED ORDER — OXYTOCIN 10 UNIT/ML IJ SOLN: 10.0000 [IU] | Freq: Once | INTRAMUSCULAR | Status: DC

## 2017-01-03 MED ORDER — OXYTOCIN 10 UNIT/ML IJ SOLN
INTRAMUSCULAR | Status: AC
  Filled 2017-01-03: qty 1

## 2017-01-03 MED ORDER — IBUPROFEN 600 MG PO TABS
600.0000 mg | ORAL_TABLET | Freq: Four times a day (QID) | ORAL | Status: DC
  Administered 2017-01-03 – 2017-01-05 (×3): 600 mg via ORAL
  Filled 2017-01-03 (×8): qty 1

## 2017-01-03 MED ORDER — RISAQUAD PO CAPS
1.0000 | ORAL_CAPSULE | Freq: Every day | ORAL | Status: DC
  Administered 2017-01-03: 1 via ORAL
  Filled 2017-01-03 (×2): qty 1

## 2017-01-03 MED ORDER — ONDANSETRON HCL 4 MG/2ML IJ SOLN: 4.0000 mg | INTRAMUSCULAR | Status: DC | PRN

## 2017-01-03 MED ORDER — SENNOSIDES-DOCUSATE SODIUM 8.6-50 MG PO TABS
2.0000 | ORAL_TABLET | ORAL | Status: DC
  Administered 2017-01-03 – 2017-01-05 (×2): 2 via ORAL
  Filled 2017-01-03 (×2): qty 2

## 2017-01-03 MED ORDER — OXYTOCIN 40 UNITS IN LACTATED RINGERS INFUSION - SIMPLE MED: 2.5000 [IU]/h | INTRAVENOUS | Status: DC

## 2017-01-03 MED ORDER — ONDANSETRON HCL 4 MG PO TABS: 4.0000 mg | ORAL_TABLET | ORAL | Status: DC | PRN

## 2017-01-03 MED ORDER — IBUPROFEN 800 MG PO TABS: 800.0000 mg | ORAL_TABLET | ORAL | Status: DC

## 2017-01-03 MED ORDER — FLEET ENEMA 7-19 GM/118ML RE ENEM: 1.0000 | ENEMA | RECTAL | Status: DC | PRN

## 2017-01-03 MED ORDER — PANTOPRAZOLE SODIUM 40 MG PO TBEC: 40.0000 mg | DELAYED_RELEASE_TABLET | Freq: Every day | ORAL | Status: DC

## 2017-01-03 MED ORDER — BISACODYL 10 MG RE SUPP: 10.0000 mg | Freq: Every day | RECTAL | Status: DC | PRN

## 2017-01-03 MED ORDER — LACTATED RINGERS IV SOLN
500.0000 mL | INTRAVENOUS | Status: DC | PRN
Start: 2017-01-03 — End: 2017-01-03

## 2017-01-03 MED ORDER — ONDANSETRON 4 MG PO TBDP
4.0000 mg | ORAL_TABLET | Freq: Three times a day (TID) | ORAL | Status: DC | PRN
  Filled 2017-01-03: qty 1

## 2017-01-03 NOTE — Plan of Care (Signed)
Progressing. Patient assisted in bathroom.

## 2017-01-03 NOTE — MAU Note (Signed)
Pt reports uc's since 0330. +Fm. Denies Bleeding

## 2017-01-03 NOTE — H&P (Signed)
OB ADMISSION/ HISTORY & PHYSICAL:  Admission Date: 01/03/2017  4:58 AM  Admit Diagnosis: term pregnancy in labor  Stacey Tate is a 26 y.o. female presenting for painful ctx since 3 am, no LOF/VB, + FM.  Desires waterbirth. No N/V/RUG pain.  Prenatal History: G2P1001   EDC : 01/03/2017, by Other Basis  [facility] since [redacted] weeks gestation  Prenatal course complicated by reflux, varicosities, smoking (working on cessation), excessive weight gain (40+lbs)  Prenatal Labs: ABO, Rh:   O pos Antibody:  neg Rubella:   imm RPR:   neg HBsAg:   neg HIV:   neg GBS:   neg 1 hr Glucola : 110   Medical / Surgical History :  Past medical history:  Past Medical History:  Diagnosis Date  . Abnormal Pap smear   . Hx of scabies   . Infections of genitourinary tract in pregnancy, unspecified as to episode of care(646.60)   . Liver and biliary tract disorders in pregnancy, unspecified as to episode of care or not applicable(646.70)   . Marginal insertion of umbilical cord   . Other known or suspected fetal abnormality, not elsewhere classified, affecting management of mother, antepartum condition or complication    fetal echogenic bowel  . Other specified complication of pregnancy, unspecified as to episode of care    pruritis gravidarum     Past surgical history:  Past Surgical History:  Procedure Laterality Date  . NO PAST SURGERIES       Family History: History reviewed. No pertinent family history.   Social History:  reports that she quit smoking about 4 years ago. she has never used smokeless tobacco. She reports that she does not drink alcohol or use drugs.   Allergies: Patient has no known allergies.    Current Medications at time of admission:  Medications Prior to Admission  Medication Sig Dispense Refill Last Dose  . acidophilus (RISAQUAD) CAPS capsule Take 1 capsule by mouth daily.   01/02/2017 at Unknown time  . pantoprazole  (PROTONIX) 40 MG tablet Take 40 mg by mouth daily.   Unknown at Unknown time  . PNV's      .           Review of Systems: ROS As noted above    Physical Exam:  Dilation: 5.5 Effacement (%): 90 Station: -1 Exam by:: Adama Ferber, CNM Vitals:   01/03/17 0516 01/03/17 0519 01/03/17 0615  BP: 133/78  110/71  Pulse: 94  96  Resp: 16    Temp: 97.9 F (36.6 C)  97.7 F (36.5 C)  TempSrc:   Oral  SpO2:   98%  Weight:  93.4 kg (206 lb) 93.4 kg (206 lb)  Height:  5\' 7"  (1.702 m) 5\' 7"  (1.702 m)    General: AAO x3, NAD, coping well w/ ctx Heart:RRR Lungs:CTAB Abdomen: non-tender, gravid, S=D, EFW 6.5-7 lbs Extremities: no edema, mild varicosities, no phlebitis Genitalia / VE: as above, no lesions, IBOW, posterior cervix, suspect ROP FHR: 140's, norm var, + accels, early decels TOCO: q 2-3 min  Labs:    CBC, RPR, T&S pending     Assessment:  26 y.o. G2P1001 at [id number]  1. Active stage of labor 2. FHR category 1 3. GBS neg 4. Desires waterbirth 5. Breastfeeding planned   Plan:  1. Admit to BS 2. Routine L&D orders 3. Hydrotherapy 4. Expectant management 5. Anticipate NSVB   Dr Juliene PinaMody notified of admission / plan of care  Neta Mendsaniela C Tarsha Blando CNM, MSN 01/03/2017, 7:04 AM

## 2017-01-03 NOTE — Anesthesia Pain Management Evaluation Note (Addendum)
  CRNA Pain Management Visit Note  Patient: Stacey Tate, 26 y.o., female  "Hello I am a member of the anesthesia team at [facility]. We have an anesthesia team available at all times to provide care throughout the hospital, including epidural management and anesthesia for C-section. I don't know your plan for the delivery whether it a natural birth, water birth, IV sedation, nitrous supplementation, doula or epidural, but we want to meet your pain goals."   1.Was your pain managed to your expectations on prior hospitalizations?   Yes   2.What is your expectation for pain management during this hospitalization?     Water tub  3.How can we help you reach that goal? Standby  Record the patient's initial score and the patient's pain goal.   Pain: 6  Pain Goal: 10 [facility] wants you to be able to say your pain was always managed very well.  Hy Swiatek 01/03/2017

## 2017-01-04 LAB — CBC
HCT: 32 % — ABNORMAL LOW (ref 36.0–46.0)
Hemoglobin: 10.6 g/dL — ABNORMAL LOW (ref 12.0–15.0)
MCH: 28.3 pg (ref 26.0–34.0)
MCHC: 33.1 g/dL (ref 30.0–36.0)
MCV: 85.6 fL (ref 78.0–100.0)
Platelets: 161 10*3/uL (ref 150–400)
RBC: 3.74 MIL/uL — ABNORMAL LOW (ref 3.87–5.11)
RDW: 14 % (ref 11.5–15.5)
WBC: 15.3 10*3/uL — ABNORMAL HIGH (ref 4.0–10.5)

## 2017-01-04 MED ORDER — MAGNESIUM OXIDE 400 (241.3 MG) MG PO TABS
400.0000 mg | ORAL_TABLET | Freq: Every day | ORAL | Status: DC
  Administered 2017-01-04 – 2017-01-05 (×2): 400 mg via ORAL
  Filled 2017-01-04 (×3): qty 1

## 2017-01-04 MED ORDER — POLYSACCHARIDE IRON COMPLEX 150 MG PO CAPS
150.0000 mg | ORAL_CAPSULE | Freq: Every day | ORAL | Status: DC
  Administered 2017-01-04 – 2017-01-05 (×2): 150 mg via ORAL
  Filled 2017-01-04 (×2): qty 1

## 2017-01-04 NOTE — Progress Notes (Signed)
PPD #1, SVD, waterbirth, 2nd degree repair, baby boy "Turner DanielsRowan"  S:  Reports feeling good with minimal discomfort              Tolerating po/ No nausea or vomiting / Denies dizziness or SOB             Bleeding is moderate             Pain controlled with Motrin             Up ad lib / ambulatory / voiding QS  Newborn breast feeding going well per mom / Circumcision - declined  O:               VS: BP 105/66 (BP Location: Left Arm)   Pulse 93   Temp 98.5 F (36.9 C) (Oral)   Resp 18   Ht 5\' 7"  (1.702 m)   Wt 93.4 kg (206 lb)   SpO2 97%   Breastfeeding? Unknown   BMI 32.26 kg/m    LABS:              Recent Labs    01/03/17 0830 01/04/17 0450  WBC 12.4* 15.3*  HGB 12.5 10.6*  PLT 202 161               Blood type: --/--/O POS (12/09 0830)  Rubella:                       I&O: Intake/Output      12/09 0701 - 12/10 0700 12/10 0701 - 12/11 0700   Urine (mL/kg/hr) 0 (0)    Blood 645    Total Output 645    Net -645         Urine Occurrence 1 x                  Physical Exam:             Alert and oriented X3  Lungs: Clear and unlabored  Heart: regular rate and rhythm / no murmurs  Abdomen: soft, non-tender, non-distended              Fundus: firm, non-tender, U-1  Perineum: well approximated 2nd degree laceration, no significant edema or erythema   Lochia: appropriate, no clots   Extremities: noedema, no calf pain or tenderness    A: PPD # 1, SVD  2nd degree laceration  Mild ABL Anemia - stable, asymptomatic    Doing well - stable status  P: Routine post partum orders  Niferex 150mg  daily   Magnesium oxide 400mg  daily  Wants early discharge home today if peds okay with discharge, otherwise discharge home tomorrow   Carlean JewsMeredith Sigmon, MSN, CNM [facility]

## 2017-01-04 NOTE — Lactation Note (Signed)
This note was copied from a baby's chart. Lactation Consultation Note  Patient Name: Boy Virgina OrganJessica Karren ZDGUY'QToday's Date: 01/04/2017 Reason for consult: Follow-up assessment  Baby 26 years old LC reviewed and updated doc flow per mom  At consult baby had a large mec stool and then mom latched  Baby by herself and baby latched and sustained latch for 5-7 mins and came off And acted like he wasn't interested.  Mom aware of the [facility] and the importance of calling for latch check. Mother informed of post-discharge support and given phone number to the lactation department, including services for phone call assistance; out-patient appointments; and breastfeeding support group. List of other breastfeeding resources in the community given in the handout. Encouraged mother to call for problems or concerns related to breastfeeding.   Maternal Data Has patient been taught Hand Expression?: Yes Does the patient have breastfeeding experience prior to this delivery?: Yes  Feeding Feeding Type: Breast Fed Length of feed: 7 min(on and off for 5-7 mins )  LATCH Score Latch: Grasps breast easily, tongue down, lips flanged, rhythmical sucking.  Audible Swallowing: A few with stimulation  Type of Nipple: Everted at rest and after stimulation  Comfort (Breast/Nipple): Soft / non-tender  Hold (Positioning): Assistance needed to correctly position infant at breast and maintain latch.  LATCH Score: 8  Interventions Interventions: Breast feeding basics reviewed;Assisted with latch;Skin to skin;Breast massage;Hand express;Breast compression;Adjust position;Support pillows;Position options  Lactation Tools Discussed/Used     Consult Status Consult Status: Follow-up Date: 01/05/17 Follow-up type: In-patient    Matilde SprangMargaret Ann Dominika Losey 01/04/2017, 4:31 PM

## 2017-01-05 MED ORDER — POLYSACCHARIDE IRON COMPLEX 150 MG PO CAPS: 150.0000 mg | ORAL_CAPSULE | Freq: Every day | ORAL | 0 refills | Status: AC

## 2017-01-05 MED ORDER — MAGNESIUM OXIDE 400 (241.3 MG) MG PO TABS: 400.0000 mg | ORAL_TABLET | Freq: Every day | ORAL | 0 refills | Status: AC

## 2017-01-05 MED ORDER — IBUPROFEN 600 MG PO TABS: 600.0000 mg | ORAL_TABLET | Freq: Four times a day (QID) | ORAL | 0 refills | Status: AC

## 2017-01-05 NOTE — Discharge Summary (Signed)
Obstetric Discharge Summary Reason for Admission: onset of labor Prenatal Procedures: none Intrapartum Procedures: spontaneous vaginal delivery / water birth Postpartum Procedures: none Complications-Operative and Postpartum: 2nd degree perineal laceration Hemoglobin  Date Value Ref Range Status  01/04/2017 10.6 (L) 12.0 - 15.0 g/dL Final   HCT  Date Value Ref Range Status  01/04/2017 32.0 (L) 36.0 - 46.0 % Final    Physical Exam:  General: alert, cooperative and no distress Lochia: appropriate Uterine Fundus: firm Incision: healing well DVT Evaluation: No evidence of DVT seen on physical exam.  Discharge Diagnoses: Term Pregnancy-delivered and water birth and mild ABL anemia  Discharge Information: Date: 01/05/2017 Activity: pelvic rest Diet: routine Medications: PNV, Ibuprofen, Iron and magnesium Condition: stable Instructions: refer to practice specific booklet Discharge to: home Follow-up Information    Neta Mendsaul, Daniela C, CNM. Schedule an appointment as soon as possible for a visit in 6 week(s).   Specialty:  Obstetrics and Gynecology Contact information: [place] [place] [address] [phone]           Newborn Data: Live born female  Birth Weight: 8 lb 6.9 oz (3824 g) APGAR: 8, 9  Newborn Delivery   Birth date/time:  01/03/2017 12:34:00 Delivery type:  Vaginal, Spontaneous     Home with mother.  Marlinda MikeBAILEY, Shanell Aden 01/05/2017, 9:31 AM

## 2017-01-05 NOTE — Discharge Instructions (Signed)
Iron-Rich Diet  Iron is a mineral that helps your body to produce hemoglobin. Hemoglobin is a protein in your red blood cells that carries oxygen to your body's tissues. Eating too little iron may cause you to feel weak and tired, and it can increase your risk for infection. Eating enough iron is necessary for your body's metabolism, muscle function, and nervous system.  Iron is naturally found in many foods. It can also be added to foods or fortified in foods. There are two types of dietary iron:   Heme iron. Heme iron is absorbed by the body more easily than nonheme iron. Heme iron is found in meat, poultry, and fish.   Nonheme iron. Nonheme iron is found in dietary supplements, iron-fortified grains, beans, and vegetables.    You may need to follow an iron-rich diet if:   You have been diagnosed with iron deficiency or iron-deficiency anemia.   You have a condition that prevents you from absorbing dietary iron, such as:  ? Infection in your intestines.  ? Celiac disease. This involves long-lasting (chronic) inflammation of your intestines.   You do not eat enough iron.   You eat a diet that is high in foods that impair iron absorption.   You have lost a lot of blood.   You have heavy bleeding during your menstrual cycle.   You are pregnant.    What is my plan?  Your health care provider may help you to determine how much iron you need per day based on your condition. Generally, when a person consumes sufficient amounts of iron in the diet, the following iron needs are met:   Men.  ? 14-18 years old: 11 mg per day.  ? 19-50 years old: 8 mg per day.   Women.  ? 14-18 years old: 15 mg per day.  ? 19-50 years old: 18 mg per day.  ? Over 50 years old: 8 mg per day.  ? Pregnant women: 27 mg per day.  ? Breastfeeding women: 9 mg per day.    What do I need to know about an iron-rich diet?   Eat fresh fruits and vegetables that are high in vitamin C along with foods that are high in iron. This will help  increase the amount of iron that your body absorbs from food, especially with foods containing nonheme iron. Foods that are high in vitamin C include oranges, peppers, tomatoes, and mango.   Take iron supplements only as directed by your health care provider. Overdose of iron can be life-threatening. If you were prescribed iron supplements, take them with orange juice or a vitamin C supplement.   Cook foods in pots and pans that are made from iron.   Eat nonheme iron-containing foods alongside foods that are high in heme iron. This helps to improve your iron absorption.   Certain foods and drinks contain compounds that impair iron absorption. Avoid eating these foods in the same meal as iron-rich foods or with iron supplements. These include:  ? Coffee, black tea, and red wine.  ? Milk, dairy products, and foods that are high in calcium.  ? Beans, soybeans, and peas.  ? Whole grains.   When eating foods that contain both nonheme iron and compounds that impair iron absorption, follow these tips to absorb iron better.  ? Soak beans overnight before cooking.  ? Soak whole grains overnight and drain them before using.  ? Ferment flours before baking, such as using yeast in bread dough.    What foods can I eat?  Grains  Iron-fortified breakfast cereal. Iron-fortified whole-wheat bread. Enriched rice. Sprouted grains.  Vegetables  Spinach. Potatoes with skin. Green peas. Broccoli. Red and green bell peppers. Fermented vegetables.  Fruits  Prunes. Raisins. Oranges. Strawberries. Mango. Grapefruit.  Meats and Other Protein Sources  Beef liver. Oysters. Beef. Shrimp. Turkey. Chicken. Tuna. Sardines. Chickpeas. Nuts. Tofu.  Beverages  Tomato juice. Fresh orange juice. Prune juice. Hibiscus tea. Fortified instant breakfast shakes.  Condiments  Tahini. Fermented soy sauce.  Sweets and Desserts  Black-strap molasses.  Other  Wheat germ.  The items listed above may not be a complete list of recommended foods or beverages.  Contact your dietitian for more options.  What foods are not recommended?  Grains  Whole grains. Bran cereal. Bran flour. Oats.  Vegetables  Artichokes. Brussels sprouts. Kale.  Fruits  Blueberries. Raspberries. Strawberries. Figs.  Meats and Other Protein Sources  Soybeans. Products made from soy protein.  Dairy  Milk. Cream. Cheese. Yogurt. Cottage cheese.  Beverages  Coffee. Black tea. Red wine.  Sweets and Desserts  Cocoa. Chocolate. Ice cream.  Other  Basil. Oregano. Parsley.  The items listed above may not be a complete list of foods and beverages to avoid. Contact your dietitian for more information.  This information is not intended to replace advice given to you by your health care provider. Make sure you discuss any questions you have with your health care provider.  Document Released: 08/26/2004 Document Revised: 08/02/2015 Document Reviewed: 08/09/2013  Elsevier Interactive Patient Education  2018 Elsevier Inc.

## 2017-01-05 NOTE — Progress Notes (Signed)
During discharge teaching, reviewed signs and symptoms of PMADs.  Encouraged pt to call OB provider if she has any of these.  [facility], showed pt information in postpartum booklet.  Offered the opportunity to speak with a SW re: this topic, or resources.  Pt declined.  Showed interest in hospital support groups, and states she will follow up as needed.  Pt remains unsure if she had TdaP.  States she will follow up with OB provider to be absolutely sure.  Advised pt she can receive the shot at [facility], the health dept., or her OB/GYN office.  Stacey BisonBettie Aurora Rody RCharity fundraiser

## 2017-01-05 NOTE — Accreditation Note (Signed)
CNM called to state clinic records show pt did not receive TdaP.  [facility] or at her [facility]GY[facility] clinic in 2 weeks.  Pt had not left due to feeding her baby.  She requested the TdaP, and it was given.  Stacey Tate

## 2017-01-05 NOTE — Progress Notes (Signed)
  CSW spoke with bedside nurse regarding MOB's EDPS score (10). Bedside nurse has no concerns regarding MOB's MH.  PPD teaching will be provided at d/c.  Bedside nurse will contact CSW if MOB requests to meet with  CSW or if any other needs arise.   Demon Volante Boyd-Gilyard, MSW, LCSW Clinical Social Work [phone] 

## 2017-01-05 NOTE — Lactation Note (Signed)
This note was copied from a baby's chart. Lactation Consultation Note  Patient Name: Stacey Tate ZOXWR'UToday's Date: 01/05/2017   Visited with P2 Mom on day of discharge, baby 7247 hrs old.  Mom states breastfeeeding is going well, but is using coconut oil for some slight soreness.  Baby has fed <8 times last 24 hrs.  3 stools, and 3 voids.  Baby is at 6% weight loss.  Last latch score 8 Baby to be kept STS and fed often on cue.  Mom denies any questions or concerns. Engorgement prevention and treatment discussed.   Mom aware of OP lactation support available.  Encouraged her to call prn for assistance.   [name], [name] E 01/05/2017, 11:54 AM

## 2017-01-05 NOTE — Progress Notes (Signed)
PPD 2 SVD with 2nd degree repair  S:  Reports feeling well - ready to go home             Tolerating po/ No nausea or vomiting             Bleeding is light             Pain controlled with motrin             Up ad lib / ambulatory / voiding QS  Newborn breast feeding  / Circumcision declined  O:   VS: BP 116/69 (BP Location: Right Arm)   Pulse 72   Temp 98.5 F (36.9 C) (Oral)   Resp 18   Ht 5\' 7"  (1.702 m)   Wt 93.4 kg (206 lb)   SpO2 97%   Breastfeeding? Unknown   BMI 32.26 kg/m    LABS:              Recent Labs    01/03/17 0830 01/04/17 0450  WBC 12.4* 15.3*  HGB 12.5 10.6*  PLT 202 161               Blood type: --/--/O POS (12/09 0830)  Rubella:    Immune             Declined tdap / flu 2018                       Physical Exam:             Alert and oriented X3  Abdomen: soft, non-tender, non-distended              Fundus: firm, non-tender, U-1  Perineum: no edema  Lochia: light  Extremities: no edema, no calf pain or tenderness    A: PPD # 2 SVD with 2nd degree repair   Doing well - stable status  P: Routine post partum orders  Dc home - WOB booklet - instructions reviewed  Marlinda MikeBAILEY, TANYA CNM, MSN, [facility] 01/05/2017, 9:26 AM

## 2017-01-07 ENCOUNTER — Ambulatory Visit: Payer: Self-pay

## 2017-01-07 NOTE — Lactation Note (Signed)
This note was copied from a baby's chart. Lactation Consultation Note  Patient Name: Stacey Tate [id number]'s Date: 01/07/2017 Reason for consult: Other (Comment);Hyperbilirubinemia(peds admit for hyperbilirubinemia )   LC received call from in patient peds unit for 4 day old baby boy readmitted for hyperbilirubinemia. LC called mom direct to pts. Room [phone]).  Mom was using DEBP for 1st time since admit yesterday afternoon.  Mom expressed 30mls.  Mom reports she has [employer] DEBP for home use.   Mom reports her breasts are feeling more full and her milk supply is increasing.  Lc noted slight increase in last weight check. Mom is reporting some nipple soreness and working with baby to get mouth open wide for feedings.  LC discussed need to soften breast with hand expression or pumping to make breast more compressible prior to latching.  Mom to hold breast compressed during feeding to allow baby to maintain latch better.  Mom verbalized understanding. LC discussed engorgement care with need to massage breasts and post pump to soften breasts after feedings is baby is sleepy at breast.   LC encouraged 15-20 minutes feedings and mom reports improving some with recent feedings.  LC discussed for current weight baby should be getting 67-6989mls with each of 8 feedings in 24 hours and more or less depending on feeding frequency.  LC would like to see baby transfer ~ 50mls for 10 feedings daily.  Mom will post pump and offer EBM back to baby. LC discussed bottle feedings as okay for "easy" calories during phototherapy.   Lc praised mom for efforts and encouraged exclusive breast milk with weight gain as appropriate.  Mom is feeling confident with plan. Lc reported by phone to RN, Stacey Tate.  Rn to provide mom with copy or allow mom to take a picture of feeding plan below.   LC discussed with mom follow [facility] [phone].  If mom is discharged 12/13 o/p currently  has an 8:30 and 10:00am appointment available and mom will need to call to confirm.  Appointments are available M-F.    Plan Feed Stacey Tate on demand with early feeding cues  8-12x/day no longer than 3 hours between feedings/pumping.  Before feedings get breasts ready,  Soften with pump or hand expression to make breast more compressible. Wait for wide open mouth for deep latch and comfort for mom during feedings. Continue to use coconut oil on nipples as needed for discomfort.  Mom to hold breast and massage during feedings.  Mom to list for swallows and keep baby active.  Rub on baby to bother him to stay awake. Mom to use double electric pump (DEBP) after feedings to soften breasts.  This may take a while, but you must soften breasts well 8x/day.  Mom to give back all expressed milk, syringe or bottle feeding ok. IF mom has breast discomfort or her breasts do not soften with feedings/pumping apply ice packs to breasts 15-20 minutes before feedings to reduce swelling.   Mom to call for outpatient lactation follow up [phone] or call with questions as needed.  Keep up the good work!!!   Interventions Interventions: Breast feeding basics reviewed  Lactation Tools Discussed/Used     Consult Status Consult Status: PRN Follow-up type: Out-patient    Stacey Tate 01/07/2017, 9:52 AM

## 2017-07-20 DIAGNOSIS — J0140 Acute pansinusitis, unspecified: Secondary | ICD-10-CM | POA: Diagnosis not present

## 2018-01-10 DIAGNOSIS — J069 Acute upper respiratory infection, unspecified: Secondary | ICD-10-CM | POA: Diagnosis not present

## 2018-01-10 DIAGNOSIS — J029 Acute pharyngitis, unspecified: Secondary | ICD-10-CM | POA: Diagnosis not present

## 2018-03-11 DIAGNOSIS — J069 Acute upper respiratory infection, unspecified: Secondary | ICD-10-CM | POA: Diagnosis not present

## 2018-03-11 DIAGNOSIS — J0110 Acute frontal sinusitis, unspecified: Secondary | ICD-10-CM | POA: Diagnosis not present

## 2018-09-21 DIAGNOSIS — F411 Generalized anxiety disorder: Secondary | ICD-10-CM | POA: Diagnosis not present

## 2018-10-17 DIAGNOSIS — Z23 Encounter for immunization: Secondary | ICD-10-CM | POA: Diagnosis not present

## 2018-10-26 DIAGNOSIS — F411 Generalized anxiety disorder: Secondary | ICD-10-CM | POA: Diagnosis not present

## 2019-01-31 DIAGNOSIS — J029 Acute pharyngitis, unspecified: Secondary | ICD-10-CM | POA: Diagnosis not present

## 2019-02-09 DIAGNOSIS — B373 Candidiasis of vulva and vagina: Secondary | ICD-10-CM | POA: Diagnosis not present

## 2019-02-10 DIAGNOSIS — R6883 Chills (without fever): Secondary | ICD-10-CM | POA: Diagnosis not present

## 2019-02-11 DIAGNOSIS — L089 Local infection of the skin and subcutaneous tissue, unspecified: Secondary | ICD-10-CM | POA: Diagnosis not present

## 2019-02-11 DIAGNOSIS — L02214 Cutaneous abscess of groin: Secondary | ICD-10-CM | POA: Diagnosis not present

## 2019-02-11 DIAGNOSIS — L02416 Cutaneous abscess of left lower limb: Secondary | ICD-10-CM | POA: Diagnosis not present

## 2019-02-13 DIAGNOSIS — L02416 Cutaneous abscess of left lower limb: Secondary | ICD-10-CM | POA: Diagnosis not present

## 2019-02-13 DIAGNOSIS — L539 Erythematous condition, unspecified: Secondary | ICD-10-CM | POA: Diagnosis not present

## 2019-02-15 DIAGNOSIS — L02214 Cutaneous abscess of groin: Secondary | ICD-10-CM | POA: Diagnosis not present

## 2019-07-24 ENCOUNTER — Ambulatory Visit (HOSPITAL_COMMUNITY)
Admission: EM | Admit: 2019-07-24 | Discharge: 2019-07-24 | Disposition: A | Discharge status: Home / Self Care | Payer: BC Managed Care – PPO | Attending: Internal Medicine | Admitting: Internal Medicine

## 2019-07-24 ENCOUNTER — Encounter (HOSPITAL_COMMUNITY): Payer: Self-pay

## 2019-07-24 ENCOUNTER — Other Ambulatory Visit: Payer: Self-pay

## 2019-07-24 DIAGNOSIS — N750 Cyst of Bartholin's gland: Secondary | ICD-10-CM

## 2019-07-24 MED ORDER — LIDOCAINE HCL 2 % IJ SOLN
INTRAMUSCULAR | Status: AC
  Filled 2019-07-24: qty 20

## 2019-07-24 MED ORDER — DOXYCYCLINE HYCLATE 100 MG PO TABS: 100.0000 mg | ORAL_TABLET | Freq: Two times a day (BID) | ORAL | 0 refills | Status: DC

## 2019-07-24 MED ORDER — LIDOCAINE HCL (PF) 1 % IJ SOLN: 10.0000 mL | Freq: Once | INTRAMUSCULAR | Status: DC

## 2019-07-24 NOTE — ED Triage Notes (Signed)
Pt presents with vaginal discomfort x 2-3 weeks. Pt states she feels a bulge inside of the vagina and is worse since Saturday after she had sex with a new partner. Pt denies any vaginal discharge, dysuria.

## 2019-07-24 NOTE — ED Provider Notes (Signed)
[facility]    CSN: [id number] Arrival date & time: 07/24/19  0932      History   Chief Complaint Chief Complaint  Patient presents with  . vaginal discomfort    HPI Stacey Tate is a 29 y.o. female.   Pt reports swelling and bulging area vaginal area.  Pt reports swelling began after sex.   The history is provided by the patient. No language interpreter was used.    Past Medical History:  Diagnosis Date  . Abnormal Pap smear   . Hx of scabies   . Infections of genitourinary tract in pregnancy, unspecified as to episode of care(646.60)   . Liver and biliary tract disorders in pregnancy, unspecified as to episode of care or not applicable(646.70)   . Marginal insertion of umbilical cord   . Other known or suspected fetal abnormality, not elsewhere classified, affecting management of mother, antepartum condition or complication    fetal echogenic bowel  . Other specified complication of pregnancy, unspecified as to episode of care    pruritis gravidarum    Patient Active Problem List   Diagnosis Date Noted  . SVD - waterbirth 12/9 01/03/2017  . Second-degree perineal laceration, with delivery 01/03/2017  . Postpartum care following vaginal delivery (12/9) 09/17/2012    Past Surgical History:  Procedure Laterality Date  . NO PAST SURGERIES      OB History    Gravida  2   Para  2   Term  2   Preterm      AB      Living  2     SAB      TAB      Ectopic      Multiple  0   Live Births  2            Home Medications    Prior to Admission medications   Medication Sig Start Date End Date Taking? Authorizing Provider  acidophilus (RISAQUAD) CAPS capsule Take 1 capsule by mouth daily.    [provider]  busPIRone (BUSPAR) 5 MG tablet Take 5 mg by mouth 3 (three) times daily. 04/18/19   [provider]  flintstones complete (FLINTSTONES) 60 MG chewable tablet Chew 2 tablets by mouth daily.    [provider]  ibuprofen (ADVIL,MOTRIN) 600 MG tablet Take 1 tablet (600 mg total) by mouth every 6 (six) hours. 01/05/17   Marlinda Mike, CNM  iron polysaccharides (NIFEREX) 150 MG capsule Take 1 capsule (150 mg total) by mouth daily. 01/05/17   Marlinda Mike, CNM  magnesium oxide (MAG-OX) 400 (241.3 Mg) MG tablet Take 1 tablet (400 mg total) by mouth daily. 01/05/17   Marlinda Mike, CNM  pantoprazole (PROTONIX) 40 MG tablet Take 40 mg by mouth daily.    [provider]  VYVANSE 40 MG capsule Take 40 mg by mouth at bedtime. 06/26/19   [provider]    Family History History reviewed. No pertinent family history.  Social History Social History   Tobacco Use  . Smoking status: Former Smoker    Quit date: 01/16/2012    Years since quitting: 7.5  . Smokeless tobacco: Never Used  Substance Use Topics  . Alcohol use: No  . Drug use: No     Allergies   Patient has no known allergies.   Review of Systems Review of Systems  All other systems reviewed and are negative.    Physical Exam Triage Vital Signs ED Triage Vitals  Enc Vitals Group     BP 07/24/19 1054 105/72     Pulse Rate 07/24/19 1054 (!) 104     Resp 07/24/19 1054 17     Temp 07/24/19 1054 99.1 F (37.3 C)     Temp Source 07/24/19 1054 Oral     SpO2 07/24/19 1054 100 %     Weight --      Height --      Head Circumference --      Peak Flow --      Pain Score 07/24/19 1052 8     Pain Loc --      Pain Edu? --      Excl. in [place]? --    No data found.  Updated Vital Signs BP 105/72 (BP Location: Left Arm)   Pulse (!) 104   Temp 99.1 F (37.3 C) (Oral)   Resp 17   LMP  (Within Weeks) Comment: 2 weeks  SpO2 100%   Visual Acuity Right Eye Distance:   Left Eye Distance:   Bilateral Distance:    Right Eye Near:   Left Eye Near:    Bilateral Near:     Physical Exam Vitals reviewed.  Constitutional:      Appearance: Normal appearance.  Cardiovascular:     Rate and Rhythm: Normal rate.    Pulmonary:     Effort: Pulmonary effort is normal.  Musculoskeletal:        General: Normal range of motion.  Skin:    General: Skin is warm.  Neurological:     General: No focal deficit present.     Mental Status: She is alert.  Psychiatric:        Mood and Affect: Mood normal.      UC Treatments / Results  Labs (all labs ordered are listed, but only abnormal results are displayed) Labs Reviewed - No data to display  EKG   Radiology No results found.  Procedures Incision and Drainage  Date/Time: 07/24/2019 12:45 PM Performed by: Fransico Meadow, PA-C Authorized by: Chase Picket, MD   Consent:    Consent obtained:  Verbal   Consent given by:  Patient   Risks discussed:  Incomplete drainage   Alternatives discussed:  No treatment Location:    Type:  Bartholin cyst   Size:  3   Location:  Anogenital   Anogenital location:  Bartholin's gland Pre-procedure details:    Skin preparation:  Betadine Anesthesia (see MAR for exact dosages):    Anesthesia method:  Local infiltration   Local anesthetic:  Lidocaine 1% w/o epi Procedure type:    Complexity:  Simple Procedure details:    Needle aspiration: no     Incision types:  Single straight   Scalpel blade:  11   Wound management:  Probed and deloculated   Drainage:  Bloody and purulent   Drainage amount:  Moderate Post-procedure details:    Patient tolerance of procedure:  Tolerated well, no immediate complications   (including critical care time)  Medications Ordered in UC Medications  lidocaine (PF) (XYLOCAINE) 1 % injection 10 mL (has no administration in time range)    Initial Impression / Assessment and Plan / UC Course  I have reviewed the triage vital signs and the nursing notes.  Pertinent labs & imaging results that were available during my care of the patient were reviewed by me and considered in my medical decision making (see chart for details).     MDM: Pt counseled on  bartholins.   Pt advised if area reurns follow up with GYN  Final Clinical Impressions(s) / UC Diagnoses   Final diagnoses:  Bartholin's duct cyst     Discharge Instructions     Follow up with OBGYN if symptoms return     ED Prescriptions    Medication Sig Dispense Auth. Provider   doxycycline (VIBRA-TABS) 100 MG tablet Take 1 tablet (100 mg total) by mouth 2 (two) times daily. 20 tablet Elson Areas, [place]     PDMP not reviewed this encounter.  An After Visit Summary was printed and given to the patient.    Elson Areas, [place] 07/24/19 1248

## 2019-07-24 NOTE — Discharge Instructions (Signed)
Follow up with OBGYN if symptoms return

## 2019-08-20 DIAGNOSIS — N750 Cyst of Bartholin's gland: Secondary | ICD-10-CM | POA: Diagnosis not present

## 2019-09-14 ENCOUNTER — Other Ambulatory Visit: Payer: BC Managed Care – PPO

## 2019-09-14 ENCOUNTER — Other Ambulatory Visit: Payer: Self-pay

## 2019-09-14 DIAGNOSIS — Z20822 Contact with and (suspected) exposure to covid-19: Secondary | ICD-10-CM | POA: Diagnosis not present

## 2019-09-15 LAB — NOVEL CORONAVIRUS, NAA: SARS-CoV-2, NAA: NOT DETECTED

## 2019-09-15 LAB — SARS-COV-2, NAA 2 DAY TAT

## 2019-09-21 DIAGNOSIS — F411 Generalized anxiety disorder: Secondary | ICD-10-CM | POA: Diagnosis not present

## 2019-09-24 ENCOUNTER — Emergency Department (HOSPITAL_BASED_OUTPATIENT_CLINIC_OR_DEPARTMENT_OTHER)
Admission: EM | Admit: 2019-09-24 | Discharge: 2019-09-24 | Disposition: A | Discharge status: Home / Self Care | Payer: BC Managed Care – PPO | Attending: Emergency Medicine | Admitting: Emergency Medicine

## 2019-09-24 ENCOUNTER — Encounter (HOSPITAL_BASED_OUTPATIENT_CLINIC_OR_DEPARTMENT_OTHER): Payer: Self-pay | Admitting: *Deleted

## 2019-09-24 ENCOUNTER — Emergency Department (HOSPITAL_COMMUNITY)
Admission: EM | Admit: 2019-09-24 | Discharge: 2019-09-24 | Disposition: A | Discharge status: Home / Self Care | Payer: BC Managed Care – PPO | Source: Home / Self Care | Attending: Emergency Medicine | Admitting: Emergency Medicine

## 2019-09-24 ENCOUNTER — Encounter (HOSPITAL_COMMUNITY): Payer: Self-pay

## 2019-09-24 ENCOUNTER — Other Ambulatory Visit: Payer: Self-pay

## 2019-09-24 DIAGNOSIS — Z87891 Personal history of nicotine dependence: Secondary | ICD-10-CM | POA: Insufficient documentation

## 2019-09-24 DIAGNOSIS — Z79899 Other long term (current) drug therapy: Secondary | ICD-10-CM | POA: Diagnosis not present

## 2019-09-24 DIAGNOSIS — Z1152 Encounter for screening for COVID-19: Secondary | ICD-10-CM | POA: Insufficient documentation

## 2019-09-24 DIAGNOSIS — Z20822 Contact with and (suspected) exposure to covid-19: Secondary | ICD-10-CM | POA: Insufficient documentation

## 2019-09-24 DIAGNOSIS — N750 Cyst of Bartholin's gland: Secondary | ICD-10-CM | POA: Diagnosis not present

## 2019-09-24 DIAGNOSIS — B349 Viral infection, unspecified: Secondary | ICD-10-CM | POA: Diagnosis not present

## 2019-09-24 LAB — SARS CORONAVIRUS 2 BY RT PCR (HOSPITAL ORDER, PERFORMED IN ~~LOC~~ HOSPITAL LAB): SARS Coronavirus 2: NEGATIVE

## 2019-09-24 MED ORDER — LIDOCAINE-EPINEPHRINE (PF) 2 %-1:200000 IJ SOLN
20.0000 mL | Freq: Once | INTRAMUSCULAR | Status: AC
  Administered 2019-09-24: 20 mL
  Filled 2019-09-24: qty 20

## 2019-09-24 NOTE — ED Provider Notes (Signed)
[facility] EMERGENCY DEPARTMENT Provider Note   CSN: [id number] Arrival date & time: 09/24/19  1749     History Chief Complaint  Patient presents with  . Cyst    Stacey Tate is a 29 y.o. female G2P2 presenting for repeat bartholin cyst. Patient reports that it has been present for about a month and is now about the size of a golf ball. Patient denies any fevers, chills, nausea, vomiting. She does not have any dysuria or difficulty with urination. She denies any abnormal discharge or pain.      Past Medical History:  Diagnosis Date  . Abnormal Pap smear   . Hx of scabies   . Infections of genitourinary tract in pregnancy, unspecified as to episode of care(646.60)   . Liver and biliary tract disorders in pregnancy, unspecified as to episode of care or not applicable(646.70)   . Marginal insertion of umbilical cord   . Other known or suspected fetal abnormality, not elsewhere classified, affecting management of mother, antepartum condition or complication    fetal echogenic bowel  . Other specified complication of pregnancy, unspecified as to episode of care    pruritis gravidarum    Patient Active Problem List   Diagnosis Date Noted  . SVD - waterbirth 12/9 01/03/2017  . Second-degree perineal laceration, with delivery 01/03/2017  . Postpartum care following vaginal delivery (12/9) 09/17/2012    Past Surgical History:  Procedure Laterality Date  . NO PAST SURGERIES       OB History    Gravida  2   Para  2   Term  2   Preterm      AB      Living  2     SAB      TAB      Ectopic      Multiple  0   Live Births  2           History reviewed. No pertinent family history.  Social History   Tobacco Use  . Smoking status: Former Smoker    Quit date: 01/16/2012    Years since quitting: 7.6  . Smokeless tobacco: Never Used  Substance Use Topics  . Alcohol use: No  . Drug use: No    Home Medications Prior to Admission medications    Medication Sig Start Date End Date Taking? Authorizing Provider  acidophilus (RISAQUAD) CAPS capsule Take 1 capsule by mouth daily.    [provider]  busPIRone (BUSPAR) 5 MG tablet Take 5 mg by mouth 3 (three) times daily. 04/18/19   [provider]  doxycycline (VIBRA-TABS) 100 MG tablet Take 1 tablet (100 mg total) by mouth 2 (two) times daily. 07/24/19   Elson Areas, PA-C  flintstones complete (FLINTSTONES) 60 MG chewable tablet Chew 2 tablets by mouth daily.    [provider]  ibuprofen (ADVIL,MOTRIN) 600 MG tablet Take 1 tablet (600 mg total) by mouth every 6 (six) hours. 01/05/17   Marlinda Mike, CNM  iron polysaccharides (NIFEREX) 150 MG capsule Take 1 capsule (150 mg total) by mouth daily. 01/05/17   Marlinda Mike, CNM  magnesium oxide (MAG-OX) 400 (241.3 Mg) MG tablet Take 1 tablet (400 mg total) by mouth daily. 01/05/17   Marlinda Mike, CNM  pantoprazole (PROTONIX) 40 MG tablet Take 40 mg by mouth daily.    [provider]  VYVANSE 40 MG capsule Take 40 mg by mouth at bedtime. 06/26/19   [provider]    Allergies  Patient has no known allergies.  Review of Systems   Review of Systems  Constitutional: Negative for chills and fever.  HENT: Negative for ear pain and sore throat.   Eyes: Negative for pain and visual disturbance.  Respiratory: Negative for cough and shortness of breath.   Cardiovascular: Negative for chest pain and palpitations.  Gastrointestinal: Negative for abdominal pain and vomiting.  Genitourinary: Negative for dysuria, hematuria, vaginal bleeding and vaginal discharge.       Bartholin cyst   Musculoskeletal: Negative for arthralgias and back pain.  Skin: Negative for color change and rash.  Neurological: Negative for seizures and syncope.  All other systems reviewed and are negative.   Physical Exam Updated Vital Signs BP 131/89 (BP Location: Left Arm)   Pulse 100   Temp 98 F (36.7 C)   Resp 18    Ht 5\' 6"  (1.676 m)   Wt 65.8 kg   LMP 09/03/2019 (Exact Date)   SpO2 100%   BMI 23.40 kg/m   Physical Exam Vitals and nursing note reviewed. Exam conducted with a chaperone present.  Genitourinary:      Comments: Cyst area is fluctuant. No surrounding erythema.    ED Results / Procedures / Treatments   Labs (all labs ordered are listed, but only abnormal results are displayed) Labs Reviewed - No data to display  EKG None  Radiology No results found.  Procedures .10/08/2021Incision and Drainage  Date/Time: 09/24/2019 10:11 AM Performed by: 09/26/2019, MD Authorized by: Melene Plan, MD   Consent:    Consent obtained:  Verbal   Consent given by:  Patient   Risks discussed:  Infection, pain and incomplete drainage   Alternatives discussed:  No treatment Location:    Type:  Bartholin cyst   Size:  4cm    Location:  Anogenital   Anogenital location:  Bartholin's gland Pre-procedure details:    Skin preparation:  Chloraprep and Betadine Anesthesia (see MAR for exact dosages):    Anesthesia method:  Local infiltration   Local anesthetic:  Lidocaine 2% WITH epi Procedure type:    Complexity:  Simple Procedure details:    Needle aspiration: no     Incision types:  Stab incision and single straight   Incision depth:  Submucosal   Scalpel blade:  11   Wound management:  Probed and deloculated and irrigated with saline   Drainage:  Bloody and serous   Drainage amount:  Moderate   Wound treatment:  Drain placed   Packing materials:  Word catheter Post-procedure details:    Patient tolerance of procedure:  Tolerated well, no immediate complications   (including critical care time)  Medications Ordered in ED Medications  lidocaine-EPINEPHrine (XYLOCAINE W/EPI) 2 %-1:200000 (PF) injection 20 mL (20 mLs Infiltration Given by Other 09/24/19 09/26/19)    ED Course  I have reviewed the triage vital signs and the nursing notes.  Pertinent labs & imaging results that  were available during my care of the patient were reviewed by me and considered in my medical decision making (see chart for details).    MDM Rules/Calculators/A&P                          29 year old female with past medical history significant for previous Bartholin cyst presents for repeat right-sided Bartholin cyst. Patient denies any pain in the area. On physical exam, patient is afebrile. The area surrounding Bartholin cyst is nonerythematous and without any rashes. The area  is fluctuant but no warmth. Procedure note as above. Patient tolerated the procedure well. Patient provided return precautions as well as instructions for the next 3 weeks. Patient to return in 3 weeks to have catheter removed. Handout provided.  Final Clinical Impression(s) / ED Diagnoses Final diagnoses:  Bartholin cyst    Rx / DC Orders ED Discharge Orders    None       Melene Plan, MD 09/24/19 1021    Alvira Monday, MD 09/25/19 [phone]

## 2019-09-24 NOTE — ED Triage Notes (Signed)
Vaginal cyst that was lanced 1 month ago, reports increased swelling last night.

## 2019-09-24 NOTE — Discharge Instructions (Signed)
COVID test is pending. You will be called if the test is positive.

## 2019-09-24 NOTE — ED Triage Notes (Signed)
Here for covid test  

## 2019-09-24 NOTE — Discharge Instructions (Addendum)
I have attached a handout of instructions as we discussed after the procedure. If you develop any pain, redness, signs of infection with white drainage, please come back to the emergency department. You can use ibuprofen or Tylenol for any mild pain.

## 2019-09-26 NOTE — ED Provider Notes (Signed)
[facility] EMERGENCY DEPARTMENT Provider Note   CSN: [id number] Arrival date & time: 09/24/19  2121     History Chief Complaint  Patient presents with  . Covid Exposure    Stacey Tate is a 29 y.o. female.  29 year old who presents with child who would like to have Covid testing.  Patient with minimal symptoms.  No cough, no runny nose.  No fever.  Child is getting tested for Covid so mother would like tested as well.  The history is provided by the patient. No language interpreter was used.       Past Medical History:  Diagnosis Date  . Abnormal Pap smear   . Hx of scabies   . Infections of genitourinary tract in pregnancy, unspecified as to episode of care(646.60)   . Liver and biliary tract disorders in pregnancy, unspecified as to episode of care or not applicable(646.70)   . Marginal insertion of umbilical cord   . Other known or suspected fetal abnormality, not elsewhere classified, affecting management of mother, antepartum condition or complication    fetal echogenic bowel  . Other specified complication of pregnancy, unspecified as to episode of care    pruritis gravidarum    Patient Active Problem List   Diagnosis Date Noted  . SVD - waterbirth 12/9 01/03/2017  . Second-degree perineal laceration, with delivery 01/03/2017  . Postpartum care following vaginal delivery (12/9) 09/17/2012    Past Surgical History:  Procedure Laterality Date  . NO PAST SURGERIES       OB History    Gravida  2   Para  2   Term  2   Preterm      AB      Living  2     SAB      TAB      Ectopic      Multiple  0   Live Births  2           History reviewed. No pertinent family history.  Social History   Tobacco Use  . Smoking status: Former Smoker    Quit date: 01/16/2012    Years since quitting: 7.6  . Smokeless tobacco: Never Used  Substance Use Topics  . Alcohol use: No  . Drug use: No    Home Medications Prior to  Admission medications   Medication Sig Start Date End Date Taking? Authorizing Provider  acidophilus (RISAQUAD) CAPS capsule Take 1 capsule by mouth daily.    [provider]  busPIRone (BUSPAR) 5 MG tablet Take 5 mg by mouth 3 (three) times daily. 04/18/19   [provider]  doxycycline (VIBRA-TABS) 100 MG tablet Take 1 tablet (100 mg total) by mouth 2 (two) times daily. 07/24/19   Elson Areas, PA-C  flintstones complete (FLINTSTONES) 60 MG chewable tablet Chew 2 tablets by mouth daily.    [provider]  ibuprofen (ADVIL,MOTRIN) 600 MG tablet Take 1 tablet (600 mg total) by mouth every 6 (six) hours. 01/05/17   Marlinda Mike, CNM  iron polysaccharides (NIFEREX) 150 MG capsule Take 1 capsule (150 mg total) by mouth daily. 01/05/17   Marlinda Mike, CNM  magnesium oxide (MAG-OX) 400 (241.3 Mg) MG tablet Take 1 tablet (400 mg total) by mouth daily. 01/05/17   Marlinda Mike, CNM  pantoprazole (PROTONIX) 40 MG tablet Take 40 mg by mouth daily.    [provider]  VYVANSE 40 MG capsule Take 40 mg by mouth at bedtime. 06/26/19   [provider]    Allergies    Patient has no known allergies.  Review of Systems   Review of Systems  All other systems reviewed and are negative.   Physical Exam Updated Vital Signs BP 109/80 (BP Location: Left Arm)   Pulse 67   Temp 98.5 F (36.9 C) (Oral)   Resp 16   LMP 09/03/2019 (Exact Date)   SpO2 100%   Physical Exam Vitals and nursing note reviewed.  Constitutional:      Appearance: She is well-developed.  HENT:     Head: Normocephalic and atraumatic.     Right Ear: External ear normal.     Left Ear: External ear normal.  Eyes:     Conjunctiva/sclera: Conjunctivae normal.  Cardiovascular:     Rate and Rhythm: Normal rate.     Heart sounds: Normal heart sounds.  Pulmonary:     Effort: Pulmonary effort is normal.     Breath sounds: Normal breath sounds.  Abdominal:     General: Bowel sounds are  normal.     Palpations: Abdomen is soft.     Tenderness: There is no abdominal tenderness. There is no rebound.  Musculoskeletal:        General: Normal range of motion.     Cervical back: Normal range of motion and neck supple.  Skin:    General: Skin is warm.  Neurological:     Mental Status: She is alert and oriented to person, place, and time.     ED Results / Procedures / Treatments   Labs (all labs ordered are listed, but only abnormal results are displayed) Labs Reviewed  SARS CORONAVIRUS 2 BY RT PCR (HOSPITAL ORDER, PERFORMED IN [facility] LAB)    EKG None  Radiology No results found.  Procedures Procedures (including critical care time)  Medications Ordered in ED Medications - No data to display  ED Course  I have reviewed the triage vital signs and the nursing notes.  Pertinent labs & imaging results that were available during my care of the patient were reviewed by me and considered in my medical decision making (see chart for details).    MDM Rules/Calculators/A&P                          8 year old who was child is sick with viral-like illness.  Concern for possible Covid.  Patient with minimal symptoms.  No cough, no runny nose.  No known fevers.  Given the increase of Covid in the community, patient could be asymptomatic carrier.  We will send Covid testing.  Discussed need for isolation while awaiting test.  Discussed signs that warrant reevaluation.  Prim Morace was evaluated in Emergency Department on 09/26/2019 for the symptoms described in the history of present illness. She was evaluated in the context of the global COVID-19 pandemic, which necessitated consideration that the patient might be at risk for infection with the SARS-CoV-2 virus that causes COVID-19. Institutional protocols and algorithms that pertain to the evaluation of patients at risk for COVID-19 are in a state of rapid change based on information released by regulatory  bodies including the CDC and federal and state organizations. These policies and algorithms were followed during the patient's care in the ED.    Final Clinical Impression(s) / ED Diagnoses Final diagnoses:  Encounter for laboratory testing for COVID-19 virus    Rx / DC Orders ED Discharge Orders    None  Niel Hummer, MD 09/26/19 [phone]

## 2019-09-28 DIAGNOSIS — F411 Generalized anxiety disorder: Secondary | ICD-10-CM | POA: Diagnosis not present

## 2019-10-05 DIAGNOSIS — F411 Generalized anxiety disorder: Secondary | ICD-10-CM | POA: Diagnosis not present

## 2019-10-19 DIAGNOSIS — F411 Generalized anxiety disorder: Secondary | ICD-10-CM | POA: Diagnosis not present

## 2019-10-24 ENCOUNTER — Other Ambulatory Visit: Payer: BC Managed Care – PPO

## 2019-10-24 DIAGNOSIS — Z03818 Encounter for observation for suspected exposure to other biological agents ruled out: Secondary | ICD-10-CM | POA: Diagnosis not present

## 2019-11-02 DIAGNOSIS — F411 Generalized anxiety disorder: Secondary | ICD-10-CM | POA: Diagnosis not present

## 2019-11-03 DIAGNOSIS — Z23 Encounter for immunization: Secondary | ICD-10-CM | POA: Diagnosis not present

## 2019-11-09 DIAGNOSIS — F411 Generalized anxiety disorder: Secondary | ICD-10-CM | POA: Diagnosis not present

## 2019-11-23 DIAGNOSIS — F411 Generalized anxiety disorder: Secondary | ICD-10-CM | POA: Diagnosis not present

## 2019-12-07 DIAGNOSIS — F411 Generalized anxiety disorder: Secondary | ICD-10-CM | POA: Diagnosis not present

## 2019-12-09 DIAGNOSIS — B373 Candidiasis of vulva and vagina: Secondary | ICD-10-CM | POA: Diagnosis not present

## 2019-12-14 DIAGNOSIS — F411 Generalized anxiety disorder: Secondary | ICD-10-CM | POA: Diagnosis not present

## 2020-01-04 DIAGNOSIS — F411 Generalized anxiety disorder: Secondary | ICD-10-CM | POA: Diagnosis not present

## 2020-01-22 DIAGNOSIS — F411 Generalized anxiety disorder: Secondary | ICD-10-CM | POA: Diagnosis not present

## 2020-01-25 DIAGNOSIS — F411 Generalized anxiety disorder: Secondary | ICD-10-CM | POA: Diagnosis not present

## 2020-02-08 DIAGNOSIS — F411 Generalized anxiety disorder: Secondary | ICD-10-CM | POA: Diagnosis not present

## 2020-02-22 DIAGNOSIS — F411 Generalized anxiety disorder: Secondary | ICD-10-CM | POA: Diagnosis not present

## 2020-03-07 DIAGNOSIS — F411 Generalized anxiety disorder: Secondary | ICD-10-CM | POA: Diagnosis not present

## 2020-03-28 DIAGNOSIS — B373 Candidiasis of vulva and vagina: Secondary | ICD-10-CM | POA: Diagnosis not present

## 2020-04-04 DIAGNOSIS — F411 Generalized anxiety disorder: Secondary | ICD-10-CM | POA: Diagnosis not present

## 2020-04-18 DIAGNOSIS — F411 Generalized anxiety disorder: Secondary | ICD-10-CM | POA: Diagnosis not present

## 2020-05-16 DIAGNOSIS — F411 Generalized anxiety disorder: Secondary | ICD-10-CM | POA: Diagnosis not present

## 2020-05-16 DIAGNOSIS — J019 Acute sinusitis, unspecified: Secondary | ICD-10-CM | POA: Diagnosis not present

## 2020-05-30 DIAGNOSIS — F411 Generalized anxiety disorder: Secondary | ICD-10-CM | POA: Diagnosis not present

## 2020-12-04 DIAGNOSIS — F411 Generalized anxiety disorder: Secondary | ICD-10-CM | POA: Diagnosis not present

## 2021-04-20 ENCOUNTER — Encounter (HOSPITAL_COMMUNITY): Payer: Self-pay

## 2021-04-20 ENCOUNTER — Emergency Department (HOSPITAL_COMMUNITY)
Admission: EM | Admit: 2021-04-20 | Discharge: 2021-04-20 | Disposition: A | Discharge status: Home / Self Care | Payer: BC Managed Care – PPO | Attending: Emergency Medicine | Admitting: Emergency Medicine

## 2021-04-20 DIAGNOSIS — R252 Cramp and spasm: Secondary | ICD-10-CM | POA: Diagnosis not present

## 2021-04-20 DIAGNOSIS — R200 Anesthesia of skin: Secondary | ICD-10-CM | POA: Insufficient documentation

## 2021-04-20 DIAGNOSIS — E871 Hypo-osmolality and hyponatremia: Secondary | ICD-10-CM | POA: Insufficient documentation

## 2021-04-20 DIAGNOSIS — R202 Paresthesia of skin: Secondary | ICD-10-CM | POA: Insufficient documentation

## 2021-04-20 DIAGNOSIS — R112 Nausea with vomiting, unspecified: Secondary | ICD-10-CM | POA: Diagnosis not present

## 2021-04-20 DIAGNOSIS — R197 Diarrhea, unspecified: Secondary | ICD-10-CM | POA: Diagnosis not present

## 2021-04-20 DIAGNOSIS — R1084 Generalized abdominal pain: Secondary | ICD-10-CM | POA: Diagnosis not present

## 2021-04-20 DIAGNOSIS — Z743 Need for continuous supervision: Secondary | ICD-10-CM | POA: Diagnosis not present

## 2021-04-20 DIAGNOSIS — R1110 Vomiting, unspecified: Secondary | ICD-10-CM | POA: Diagnosis not present

## 2021-04-20 DIAGNOSIS — E876 Hypokalemia: Secondary | ICD-10-CM | POA: Diagnosis not present

## 2021-04-20 DIAGNOSIS — R110 Nausea: Secondary | ICD-10-CM | POA: Diagnosis not present

## 2021-04-20 LAB — URINALYSIS, ROUTINE W REFLEX MICROSCOPIC
Bilirubin Urine: NEGATIVE
Glucose, UA: NEGATIVE mg/dL
Hgb urine dipstick: NEGATIVE
Ketones, ur: 15 mg/dL — AB
Leukocytes,Ua: NEGATIVE
Nitrite: NEGATIVE
Specific Gravity, Urine: 1.015 (ref 1.005–1.030)
pH: 8 (ref 5.0–8.0)

## 2021-04-20 LAB — COMPREHENSIVE METABOLIC PANEL
ALT: 16 U/L (ref 0–44)
AST: 23 U/L (ref 15–41)
Albumin: 3.9 g/dL (ref 3.5–5.0)
Alkaline Phosphatase: 57 U/L (ref 38–126)
Anion gap: 10 (ref 5–15)
BUN: 18 mg/dL (ref 6–20)
CO2: 21 mmol/L — ABNORMAL LOW (ref 22–32)
Calcium: 8.3 mg/dL — ABNORMAL LOW (ref 8.9–10.3)
Chloride: 103 mmol/L (ref 98–111)
Creatinine, Ser: 0.7 mg/dL (ref 0.44–1.00)
GFR, Estimated: >60 mL/min (ref >60)
Glucose, Bld: 109 mg/dL — ABNORMAL HIGH (ref 70–99)
Potassium: 3.4 mmol/L — ABNORMAL LOW (ref 3.5–5.1)
Sodium: 134 mmol/L — ABNORMAL LOW (ref 135–145)
Total Bilirubin: 0.5 mg/dL (ref 0.3–1.2)
Total Protein: 7.7 g/dL (ref 6.5–8.1)

## 2021-04-20 LAB — HCG, QUANTITATIVE, PREGNANCY: hCG, Beta Chain, Quant, S: <1 m[IU]/mL (ref <5)

## 2021-04-20 LAB — URINALYSIS, MICROSCOPIC (REFLEX)

## 2021-04-20 LAB — CBC
HCT: 40 % (ref 36.0–46.0)
Hemoglobin: 13.1 g/dL (ref 12.0–15.0)
MCH: 27.3 pg (ref 26.0–34.0)
MCHC: 32.8 g/dL (ref 30.0–36.0)
MCV: 83.5 fL (ref 80.0–100.0)
Platelets: 238 10*3/uL (ref 150–400)
RBC: 4.79 MIL/uL (ref 3.87–5.11)
RDW: 13.4 % (ref 11.5–15.5)
WBC: 10.5 10*3/uL (ref 4.0–10.5)
nRBC: 0 % (ref 0.0–0.2)

## 2021-04-20 LAB — LIPASE, BLOOD: Lipase: 27 U/L (ref 11–51)

## 2021-04-20 MED ORDER — DICYCLOMINE HCL 10 MG PO CAPS
20.0000 mg | ORAL_CAPSULE | Freq: Once | ORAL | Status: AC
  Administered 2021-04-20: 20 mg via ORAL
  Filled 2021-04-20: qty 2

## 2021-04-20 MED ORDER — ONDANSETRON HCL 4 MG/2ML IJ SOLN
4.0000 mg | Freq: Once | INTRAMUSCULAR | Status: AC
  Administered 2021-04-20: 4 mg via INTRAVENOUS
  Filled 2021-04-20: qty 2

## 2021-04-20 MED ORDER — ONDANSETRON 4 MG PO TBDP: ORAL_TABLET | ORAL | 0 refills | Status: AC

## 2021-04-20 MED ORDER — DICYCLOMINE HCL 10 MG/ML IM SOLN
20.0000 mg | Freq: Once | INTRAMUSCULAR | Status: DC
Start: 2021-04-20 — End: 2021-04-20

## 2021-04-20 MED ORDER — FAMOTIDINE IN NACL 20-0.9 MG/50ML-% IV SOLN
20.0000 mg | Freq: Once | INTRAVENOUS | Status: AC
  Administered 2021-04-20: 20 mg via INTRAVENOUS
  Filled 2021-04-20: qty 50

## 2021-04-20 MED ORDER — LACTATED RINGERS IV BOLUS
1000.0000 mL | Freq: Once | INTRAVENOUS | Status: AC
  Administered 2021-04-20: 1000 mL via INTRAVENOUS

## 2021-04-20 MED ORDER — DICYCLOMINE HCL 20 MG PO TABS: 20.0000 mg | ORAL_TABLET | Freq: Three times a day (TID) | ORAL | 0 refills | Status: AC | PRN

## 2021-04-20 NOTE — ED Triage Notes (Signed)
Pt arrived via EMS c/o n/v diarrhea and tingling/numbness throughout arms/legs States she believes this is due to eating some pulled pork that was sitting out yesterday.  ?

## 2021-04-20 NOTE — ED Provider Notes (Signed)
?[facility] DEPT ?Provider Note ? ? ?CSN: [id number] ?Arrival date & time: 04/20/21  1205 ? ?  ? ?History ? ?Chief Complaint  ?Patient presents with  ? Emesis  ? ? ?Stacey Tate is a 31 y.o. female. ? ?Stacey Tate is a 31 year-old-female with no pertinent past medical history who presents to the ED for complaints of N/V/D and extremity tingling and numbness. Yesterday patient went hiking and played in a waterfall, also states she ate a pulled pork salad that was left outside at room temperature for the whole day. Then around midnight, she started experiencing severe N/V/D. Diarrhea described as watery and brown. No blood in stool or emesis. Has not tried any medications for her symptoms. Patient endorses generalized crampy abdominal pain and feeling unwell. She denies any fever, chills, cough, shortness of breath, constipation, dysuria, hematuria, or hematochezia. No known sick contacts, similar symptoms in close contacts, or recent overseas travels. States she is sexually active, does not use birth control or condoms. Consumes alcohol socially and states she took a marijuana gummy yesterday. ? ?The history is provided by the patient and a significant other.  ? ?  ? ?Home Medications ?Prior to Admission medications   ?Medication Sig Start Date End Date Taking? Authorizing Provider  ?dicyclomine (BENTYL) 20 MG tablet Take 1 tablet (20 mg total) by mouth 3 (three) times daily as needed for spasms. 04/20/21  Yes Jacqlyn Larsen, PA-C  ?ondansetron (ZOFRAN-ODT) 4 MG disintegrating tablet 4mg  ODT q4 hours prn nausea/vomit 04/20/21  Yes Jacarri Gesner, Audery Amel, PA-C  ?acidophilus (RISAQUAD) CAPS capsule Take 1 capsule by mouth daily.    [provider]  ?busPIRone (BUSPAR) 5 MG tablet Take 5 mg by mouth 3 (three) times daily. 04/18/19   [provider]  ?doxycycline (VIBRA-TABS) 100 MG tablet Take 1 tablet (100 mg total) by mouth 2 (two) times daily. 07/24/19   Fransico Meadow, PA-C   ?flintstones complete (FLINTSTONES) 60 MG chewable tablet Chew 2 tablets by mouth daily.    [provider]  ?ibuprofen (ADVIL,MOTRIN) 600 MG tablet Take 1 tablet (600 mg total) by mouth every 6 (six) hours. 01/05/17   Artelia Laroche, CNM  ?iron polysaccharides (NIFEREX) 150 MG capsule Take 1 capsule (150 mg total) by mouth daily. 01/05/17   Artelia Laroche, CNM  ?magnesium oxide (MAG-OX) 400 (241.3 Mg) MG tablet Take 1 tablet (400 mg total) by mouth daily. 01/05/17   Artelia Laroche, CNM  ?pantoprazole (PROTONIX) 40 MG tablet Take 40 mg by mouth daily.    [provider]  ?VYVANSE 40 MG capsule Take 40 mg by mouth at bedtime. 06/26/19   [provider]  ?   ? ?Allergies    ?Patient has no known allergies.   ? ?Review of Systems   ?Review of Systems  ?Constitutional:  Negative for chills and fever.  ?HENT: Negative.    ?Respiratory:  Negative for cough and shortness of breath.   ?Cardiovascular:  Negative for chest pain.  ?Gastrointestinal:  Positive for abdominal pain, diarrhea, nausea and vomiting.  ?Genitourinary:  Negative for dysuria and frequency.  ?Musculoskeletal:  Negative for arthralgias and myalgias.  ?Neurological:  Negative for syncope and light-headedness.  ?All other systems reviewed and are negative. ? ?Physical Exam ?Updated Vital Signs ?BP 113/73 (BP Location: Left Arm)   Pulse 97   Temp 97.8 ?F (36.6 ?C) (Oral)   Resp 15   SpO2 100%  ?Physical Exam ?Vitals and nursing note reviewed.  ?Constitutional:   ?  General: She is not in acute distress. ?   Appearance: Normal appearance. She is well-developed and normal weight. She is not ill-appearing or diaphoretic.  ?HENT:  ?   Head: Normocephalic and atraumatic.  ?   Mouth/Throat:  ?   Mouth: Mucous membranes are moist.  ?   Pharynx: Oropharynx is clear.  ?Eyes:  ?   General:     ?   Right eye: No discharge.     ?   Left eye: No discharge.  ?Cardiovascular:  ?   Rate and Rhythm: Normal rate and regular rhythm.  ?   Heart  sounds: Normal heart sounds.  ?Pulmonary:  ?   Effort: Pulmonary effort is normal. No respiratory distress.  ?   Breath sounds: Normal breath sounds.  ?   Comments: Respirations equal and unlabored, patient able to speak in full sentences, lungs clear to auscultation bilaterally  ?Abdominal:  ?   General: Abdomen is flat. There is no distension.  ?   Palpations: Abdomen is soft. There is no mass.  ?   Tenderness: There is abdominal tenderness. There is no guarding.  ?   Comments: Abdomen is soft, nondistended, bowel sounds present throughout, there is very mild generalized tenderness that does not localize any 1 quadrant, no guarding or peritoneal signs.  ?Musculoskeletal:     ?   General: No deformity.  ?Skin: ?   General: Skin is warm and dry.  ?Neurological:  ?   Mental Status: She is alert and oriented to person, place, and time.  ?   Coordination: Coordination normal.  ?Psychiatric:     ?   Mood and Affect: Mood normal.     ?   Behavior: Behavior normal.  ? ? ?ED Results / Procedures / Treatments   ?Labs ?(all labs ordered are listed, but only abnormal results are displayed) ?Labs Reviewed  ?COMPREHENSIVE METABOLIC PANEL - Abnormal; Notable for the following components:  ?    Result Value  ? Sodium 134 (*)   ? Potassium 3.4 (*)   ? CO2 21 (*)   ? Glucose, Bld 109 (*)   ? Calcium 8.3 (*)   ? All other components within normal limits  ?URINALYSIS, ROUTINE W REFLEX MICROSCOPIC - Abnormal; Notable for the following components:  ? Ketones, ur 15 (*)   ? Protein, ur TRACE (*)   ? All other components within normal limits  ?URINALYSIS, MICROSCOPIC (REFLEX) - Abnormal; Notable for the following components:  ? Bacteria, UA RARE (*)   ? All other components within normal limits  ?LIPASE, BLOOD  ?CBC  ?HCG, QUANTITATIVE, PREGNANCY  ? ? ?EKG ?None ? ?Radiology ?No results found. ? ?Procedures ?Procedures  ? ? ?Medications Ordered in ED ?Medications  ?lactated ringers bolus 1,000 mL (1,000 mLs Intravenous New Bag/Given  04/20/21 1611)  ?ondansetron [facility]) injection 4 mg (4 mg Intravenous Given 04/20/21 1609)  ?famotidine (PEPCID) IVPB 20 mg premix (20 mg Intravenous New Bag/Given 04/20/21 1609)  ?dicyclomine (BENTYL) capsule 20 mg (20 mg Oral Given 04/20/21 1555)  ? ? ?ED Course/ Medical Decision Making/ A&P ?  ?                        ?This patient presents to the ED for concern of nausea, vomiting, diarrhea and generalized abdominal pain, this involves an extensive number of treatment options, and is a complaint that carries with it a high risk of complications and morbidity.  The differential diagnosis includes  gastroenteritis, foodborne illness, hepatitis, colitis, PUD, cholecystitis, appendicitis, diverticulitis, pregnancy ? ? ?Additional history obtained: ? ?Additional history obtained from significant other at bedside with patient ?External records from outside source obtained and reviewed including prior ED visits ? ? ?Lab Tests: ? ?I Ordered, and personally interpreted labs.  The pertinent results include: No leukocytosis, normal hemoglobin, potassium of 3.4 and sodium of 134 likely in the setting of vomiting, CO2 21, glucose 109, normal renal and liver function and normal lipase, negative pregnancy, UA without signs of infection. ? ? ?Imaging Studies ordered: ? ?Considered imaging but given that patient has very mild generalized tenderness that does not localize have low suspicion for acute intra-abdominal findings and do not think this would change management at this time. ? ? ?Cardiac Monitoring: / EKG: ? ?The patient was maintained on a cardiac monitor.  I personally viewed and interpreted the cardiac monitored which showed an underlying rhythm of: NSR rate 80-90 ? ? ?Problem List / ED Course / Critical interventions / Medication management ? ?Patient presents with nausea, vomiting and diarrhea as well as mild crampy generalized abdominal pain since midnight, unable to tolerate oral food or fluids. ?Reassuring abdominal  exam and lab work overall looks good, mild dehydration, IV fluids, Zofran, Pepcid and Bentyl given with significant improvement in symptoms and patient now tolerating p.o. fluids.  High clinical suspicion for viral o

## 2021-04-20 NOTE — ED Provider Triage Note (Signed)
Emergency Medicine Provider Triage Evaluation Note ? ?Stacey Tate , a 31 y.o. female  was evaluated in triage.  Pt complains of nvd that started last night with intermittent abd pain. ? ?Review of Systems  ?Positive: Nvd, abd pain ?Negative: Fever, dysuria ? ?Physical Exam  ?BP 127/89 (BP Location: Left Arm)   Pulse 84   Temp 97.8 ?F (36.6 ?C) (Oral)   Resp 18   SpO2 100%  ?Gen:   Awake, no distress   ?Resp:  Normal effort  ?MSK:   Moves extremities without difficulty  ?Other:   ? ?Medical Decision Making  ?Medically screening exam initiated at 1:20 PM.  Appropriate orders placed.  Tersa Schroff was informed that the remainder of the evaluation will be completed by another provider, this initial triage assessment does not replace that evaluation, and the importance of remaining in the ED until their evaluation is complete. ? ? ?  ?Rodney Booze, PA-C ?04/20/21 1321 ? ?

## 2021-04-20 NOTE — Discharge Instructions (Addendum)
Your lab work is reassuring, symptoms likely due to viral or foodborne GI bug.  Treat symptoms supportively with Zofran as needed for nausea and vomiting, Pepcid twice daily to help with stomach irritation and Bentyl as needed for cramping abdominal pain. ? ?Stick to clear liquids for the rest of the day and then eat a bland diet and slowly advance your diet as tolerated. ? ?Symptoms are usually self resolved within about 2 days if you continue having symptoms for 4 days follow-up with your primary care doctor or urgent care return to the emergency department if you begin having fevers, worsening abdominal pain, blood in stool or persistent vomiting you cannot tolerate fluids. ?

## 2021-10-16 DIAGNOSIS — N390 Urinary tract infection, site not specified: Secondary | ICD-10-CM | POA: Diagnosis not present

## 2022-03-02 ENCOUNTER — Ambulatory Visit: Payer: BC Managed Care – PPO | Admitting: Adult Health

## 2022-03-03 DIAGNOSIS — F411 Generalized anxiety disorder: Secondary | ICD-10-CM | POA: Diagnosis not present

## 2022-04-18 DIAGNOSIS — S5292XA Unspecified fracture of left forearm, initial encounter for closed fracture: Secondary | ICD-10-CM | POA: Diagnosis not present

## 2022-04-18 DIAGNOSIS — M25522 Pain in left elbow: Secondary | ICD-10-CM | POA: Diagnosis not present

## 2022-04-21 DIAGNOSIS — S52122A Displaced fracture of head of left radius, initial encounter for closed fracture: Secondary | ICD-10-CM | POA: Diagnosis not present

## 2022-05-01 DIAGNOSIS — S52122D Displaced fracture of head of left radius, subsequent encounter for closed fracture with routine healing: Secondary | ICD-10-CM | POA: Diagnosis not present

## 2022-05-18 DIAGNOSIS — S52122A Displaced fracture of head of left radius, initial encounter for closed fracture: Secondary | ICD-10-CM | POA: Diagnosis not present

## 2022-08-19 ENCOUNTER — Other Ambulatory Visit (HOSPITAL_COMMUNITY): Payer: Self-pay

## 2022-08-19 MED ORDER — LISDEXAMFETAMINE DIMESYLATE 40 MG PO CAPS
40.0000 mg | ORAL_CAPSULE | Freq: Every morning | ORAL | 0 refills | Status: AC
  Filled 2022-10-16: qty 30, 30d supply, fill #0

## 2022-08-19 MED ORDER — LISDEXAMFETAMINE DIMESYLATE 40 MG PO CAPS
40.0000 mg | ORAL_CAPSULE | Freq: Every morning | ORAL | 0 refills | Status: AC
Start: 2022-08-19 — End: ?
  Filled 2022-08-19: qty 30, 30d supply, fill #0

## 2022-08-24 ENCOUNTER — Other Ambulatory Visit (HOSPITAL_COMMUNITY): Payer: Self-pay

## 2022-09-01 ENCOUNTER — Encounter (HOSPITAL_BASED_OUTPATIENT_CLINIC_OR_DEPARTMENT_OTHER): Payer: Self-pay | Admitting: Emergency Medicine

## 2022-09-01 ENCOUNTER — Other Ambulatory Visit: Payer: Self-pay

## 2022-09-01 ENCOUNTER — Emergency Department (HOSPITAL_BASED_OUTPATIENT_CLINIC_OR_DEPARTMENT_OTHER)
Admission: EM | Admit: 2022-09-01 | Discharge: 2022-09-01 | Disposition: A | Discharge status: Home / Self Care | Payer: 59 | Attending: Emergency Medicine | Admitting: Emergency Medicine

## 2022-09-01 DIAGNOSIS — N751 Abscess of Bartholin's gland: Secondary | ICD-10-CM | POA: Diagnosis not present

## 2022-09-01 MED ORDER — DOXYCYCLINE HYCLATE 100 MG PO CAPS: 100.0000 mg | ORAL_CAPSULE | Freq: Two times a day (BID) | ORAL | 0 refills | Status: AC

## 2022-09-01 MED ORDER — LORAZEPAM 1 MG PO TABS
1.0000 mg | ORAL_TABLET | Freq: Once | ORAL | Status: AC
Start: 2022-09-01 — End: 2022-09-01
  Administered 2022-09-01: 1 mg via ORAL
  Filled 2022-09-01: qty 1

## 2022-09-01 MED ORDER — HYDROCODONE-ACETAMINOPHEN 5-325 MG PO TABS: 2.0000 | ORAL_TABLET | ORAL | 0 refills | Status: AC | PRN

## 2022-09-01 MED ORDER — LIDOCAINE HCL (PF) 1 % IJ SOLN
10.0000 mL | Freq: Once | INTRAMUSCULAR | Status: AC
  Administered 2022-09-01: 10 mL
  Filled 2022-09-01: qty 10

## 2022-09-01 MED ORDER — OXYCODONE-ACETAMINOPHEN 5-325 MG PO TABS
1.0000 | ORAL_TABLET | Freq: Once | ORAL | Status: AC
  Administered 2022-09-01: 1 via ORAL
  Filled 2022-09-01: qty 1

## 2022-09-01 NOTE — ED Triage Notes (Signed)
Pt POV steady gait, c/o worsening abscess to right labia x1 month, more painful and noticeably infected yesterday.   Taken advil appx 0800 today.

## 2022-09-01 NOTE — ED Provider Notes (Addendum)
[place] [facility] Provider Note   CSN: [id number] Arrival date & time: 09/01/22  1531     History  Chief Complaint  Patient presents with   Abscess    Stacey Tate is a 32 y.o. female.  Patient with noncontributory past medical history presents today with complaints of abscess.  She says that she noticed a bump in her labia about a month ago but did not start to be painful until a few days ago.  States she has had a Bartholin cyst previously that had to be drained several years ago and notes this feels similar.  It is not draining.  Denies any fevers or chills.  The history is provided by the patient. No language interpreter was used.  Abscess      Home Medications Prior to Admission medications   Medication Sig Start Date End Date Taking? Authorizing Provider  acidophilus (RISAQUAD) CAPS capsule Take 1 capsule by mouth daily.    [provider]  busPIRone (BUSPAR) 5 MG tablet Take 5 mg by mouth 3 (three) times daily. 04/18/19   [provider]  dicyclomine (BENTYL) 20 MG tablet Take 1 tablet (20 mg total) by mouth 3 (three) times daily as needed for spasms. 04/20/21   Dartha Lodge, PA-C  doxycycline (VIBRA-TABS) 100 MG tablet Take 1 tablet (100 mg total) by mouth 2 (two) times daily. 07/24/19   Elson Areas, PA-C  flintstones complete (FLINTSTONES) 60 MG chewable tablet Chew 2 tablets by mouth daily.    [provider]  ibuprofen (ADVIL,MOTRIN) 600 MG tablet Take 1 tablet (600 mg total) by mouth every 6 (six) hours. 01/05/17   Marlinda Mike, CNM  iron polysaccharides (NIFEREX) 150 MG capsule Take 1 capsule (150 mg total) by mouth daily. 01/05/17   Marlinda Mike, CNM  lisdexamfetamine (VYVANSE) 40 MG capsule Take 1 capsule by mouth every morning 09/16/22     lisdexamfetamine (VYVANSE) 40 MG capsule Take 1 capsule by mouth every morning 08/19/22     magnesium oxide (MAG-OX) 400 (241.3 Mg) MG tablet Take 1 tablet  (400 mg total) by mouth daily. 01/05/17   Marlinda Mike, CNM  ondansetron (ZOFRAN-ODT) 4 MG disintegrating tablet 4mg  ODT q4 hours prn nausea/vomit 04/20/21   Dartha Lodge, PA-C  pantoprazole (PROTONIX) 40 MG tablet Take 40 mg by mouth daily.    [provider]  VYVANSE 40 MG capsule Take 40 mg by mouth at bedtime. 06/26/19   [provider]      Allergies    Patient has no known allergies.    Review of Systems   Review of Systems  All other systems reviewed and are negative.   Physical Exam Updated Vital Signs BP 109/65   Pulse 84   Temp 98.5 F (36.9 C)   Resp 17   Ht 5\' 6"  (1.676 m)   Wt 72.6 kg   LMP 08/28/2022 (Approximate)   SpO2 99%   BMI 25.82 kg/m  Physical Exam Vitals and nursing note reviewed. Exam conducted with a chaperone present.  Constitutional:      General: She is not in acute distress.    Appearance: Normal appearance. She is normal weight. She is not ill-appearing, toxic-appearing or diaphoretic.  HENT:     Head: Normocephalic and atraumatic.  Cardiovascular:     Rate and Rhythm: Normal rate.  Pulmonary:     Effort: Pulmonary effort is normal. No respiratory distress.  Genitourinary:    Comments: Obvious right  sided labial abscess without drainage.  Musculoskeletal:        General: Normal range of motion.     Cervical back: Normal range of motion.  Skin:    General: Skin is warm and dry.  Neurological:     General: No focal deficit present.     Mental Status: She is alert.  Psychiatric:        Mood and Affect: Mood normal.        Behavior: Behavior normal.     ED Results / Procedures / Treatments   Labs (all labs ordered are listed, but only abnormal results are displayed) Labs Reviewed - No data to display  EKG None  Radiology No results found.  Procedures .Marland KitchenIncision and Drainage  Date/Time: 09/01/2022 5:58 PM  Performed by: Silva Bandy, PA-C Authorized by: Silva Bandy, PA-C   Consent:    Consent  obtained:  Verbal   Consent given by:  Patient   Risks, benefits, and alternatives were discussed: yes     Risks discussed:  Bleeding, incomplete drainage, pain, infection and damage to other organs   Alternatives discussed:  No treatment, delayed treatment, alternative treatment, observation and referral Universal protocol:    Procedure explained and questions answered to patient or proxy's satisfaction: yes     Patient identity confirmed:  Verbally with patient Location:    Type:  Bartholin cyst   Size:  3 cm x 3 cm   Location:  Anogenital   Anogenital location:  Bartholin's gland Pre-procedure details:    Skin preparation:  Povidone-iodine Sedation:    Sedation type:  Anxiolysis Anesthesia:    Anesthesia method:  Local infiltration   Local anesthetic:  Lidocaine 1% w/o epi Procedure type:    Complexity:  Simple Procedure details:    Incision types:  Stab incision   Wound management:  Probed and deloculated   Drainage:  Purulent and bloody   Drainage amount:  Moderate   Wound treatment:  Drain placed   Packing materials:  Word catheter Post-procedure details:    Procedure completion:  Tolerated well, no immediate complications     Medications Ordered in ED Medications  oxyCODONE-acetaminophen (PERCOCET/ROXICET) 5-325 MG per tablet 1-2 tablet (1 tablet Oral Given 09/01/22 1633)  LORazepam (ATIVAN) tablet 1 mg (1 mg Oral Given 09/01/22 1633)  lidocaine (PF) (XYLOCAINE) 1 % injection 10 mL (10 mLs Infiltration Given by Other 09/01/22 0981)    ED Course/ Medical Decision Making/ A&P                                 Medical Decision Making Risk Prescription drug management.   Patient presents today with complaints of right sided Bartholin's abscess amenable to drainage per above procedure which was well-tolerated.  Chart reviewed, patient has had this procedure done previously in 2021.  She is afebrile, nontoxic-appearing, and in no acute distress with reassuring vital signs.   Word catheter placed in same.  Plan for wound recheck in 2 days with OB/GYN. Mild signs of cellulitis is surrounding skin, will give doxycycline. Will also give Vicodin for pain.  PDMP reviewed, patient advised not to drive or operate heavy machinery while taking this medication. Evaluation and diagnostic testing in the emergency department does not suggest an emergent condition requiring admission or immediate intervention beyond what has been performed at this time.  Plan for discharge with close PCP follow-up.  Patient is understanding and amenable with  plan, educated on red flag symptoms that would prompt immediate return.  Patient discharged in stable condition.   Final Clinical Impression(s) / ED Diagnoses Final diagnoses:  Bartholin's gland abscess    Rx / DC Orders ED Discharge Orders          Ordered    doxycycline (VIBRAMYCIN) 100 MG capsule  2 times daily        09/01/22 1804          An After Visit Summary was printed and given to the patient.     Vear Clock 09/01/22 1805    Silva Bandy, PA-C 09/01/22 1811    Sloan Leiter, DO 09/01/22 2354

## 2022-09-01 NOTE — Discharge Instructions (Addendum)
You were seen in the emergency department for an abscess.  We have drained the area and cleaned it.  We have placed a catheter in the area to ensure that it remains open.  I would like you to have the wound checked and the catheter removed in 2-3 days. This can be done by any doctor's office, urgent care, or emergency department. This is to make sure the area hasn't closed too soon. Try to keep the area as clean and dry as possible. It is okay to let warm soapy water run over the area, but do NOT scrub the area.   I am placing you on a course of antibiotics. It is important you finish the entire course! You can take ibuprofen or tylenol as needed for pain.   You can use an antiseptic (chlorhexidine) soap from the pharmacy 1-2 x per month in the areas where abscesses are most likely to form (armpits, buttocks, groin). This soap can dry your skin out so use it sparingly. Once do so once this area has fully healed.   Continue to monitor how you're doing and return to the ER for new or worsening symptoms.

## 2022-10-16 ENCOUNTER — Other Ambulatory Visit (HOSPITAL_COMMUNITY): Payer: Self-pay

## 2022-10-16 ENCOUNTER — Other Ambulatory Visit: Payer: Self-pay

## 2023-02-11 ENCOUNTER — Other Ambulatory Visit (HOSPITAL_COMMUNITY): Payer: Self-pay

## 2023-02-12 ENCOUNTER — Other Ambulatory Visit (HOSPITAL_COMMUNITY): Payer: Self-pay

## 2023-03-03 ENCOUNTER — Other Ambulatory Visit (HOSPITAL_COMMUNITY): Payer: Self-pay

## 2023-03-16 ENCOUNTER — Other Ambulatory Visit (HOSPITAL_COMMUNITY): Payer: Self-pay

## 2023-03-16 MED ORDER — BUSPIRONE HCL 5 MG PO TABS
5.0000 mg | ORAL_TABLET | Freq: Three times a day (TID) | ORAL | 2 refills | Status: AC
  Filled 2023-03-16: qty 90, 30d supply, fill #0

## 2023-03-16 MED ORDER — LISDEXAMFETAMINE DIMESYLATE 40 MG PO CAPS
40.0000 mg | ORAL_CAPSULE | Freq: Every morning | ORAL | 0 refills | Status: DC
  Filled 2023-03-16: qty 30, 30d supply, fill #0

## 2023-03-26 ENCOUNTER — Other Ambulatory Visit (HOSPITAL_COMMUNITY): Payer: Self-pay

## 2023-04-27 ENCOUNTER — Other Ambulatory Visit (HOSPITAL_COMMUNITY): Payer: Self-pay

## 2023-04-28 ENCOUNTER — Other Ambulatory Visit (HOSPITAL_COMMUNITY): Payer: Self-pay

## 2023-04-28 MED ORDER — LISDEXAMFETAMINE DIMESYLATE 40 MG PO CAPS
40.0000 mg | ORAL_CAPSULE | Freq: Every morning | ORAL | 0 refills | Status: AC
  Filled 2023-04-28 – 2023-05-13 (×2): qty 30, 30d supply, fill #0

## 2023-04-28 MED ORDER — LISDEXAMFETAMINE DIMESYLATE 40 MG PO CAPS
40.0000 mg | ORAL_CAPSULE | Freq: Every morning | ORAL | 0 refills | Status: AC
Start: 2023-05-25 — End: ?
  Filled 2023-06-15: qty 30, 30d supply, fill #0

## 2023-05-10 ENCOUNTER — Other Ambulatory Visit (HOSPITAL_COMMUNITY): Payer: Self-pay

## 2023-05-13 ENCOUNTER — Other Ambulatory Visit (HOSPITAL_COMMUNITY): Payer: Self-pay

## 2023-05-31 ENCOUNTER — Other Ambulatory Visit: Payer: Self-pay

## 2023-05-31 ENCOUNTER — Other Ambulatory Visit (HOSPITAL_COMMUNITY): Payer: Self-pay

## 2023-05-31 MED ORDER — LISDEXAMFETAMINE DIMESYLATE 40 MG PO CAPS: 40.0000 mg | ORAL_CAPSULE | Freq: Every morning | ORAL | 0 refills | Status: AC

## 2023-05-31 MED ORDER — BUSPIRONE HCL 5 MG PO TABS
5.0000 mg | ORAL_TABLET | Freq: Three times a day (TID) | ORAL | 2 refills | Status: AC
  Filled 2023-05-31: qty 90, 30d supply, fill #0

## 2023-05-31 MED ORDER — LISDEXAMFETAMINE DIMESYLATE 40 MG PO CAPS
40.0000 mg | ORAL_CAPSULE | Freq: Every morning | ORAL | 0 refills | Status: AC
  Filled 2023-05-31 – 2023-07-26 (×2): qty 30, 30d supply, fill #0

## 2023-05-31 MED ORDER — LISDEXAMFETAMINE DIMESYLATE 40 MG PO CAPS
40.0000 mg | ORAL_CAPSULE | Freq: Every morning | ORAL | 0 refills | Status: AC
  Filled 2023-08-31: qty 30, 30d supply, fill #0

## 2023-06-10 ENCOUNTER — Other Ambulatory Visit (HOSPITAL_COMMUNITY): Payer: Self-pay

## 2023-06-15 ENCOUNTER — Other Ambulatory Visit: Payer: Self-pay

## 2023-06-15 ENCOUNTER — Other Ambulatory Visit (HOSPITAL_COMMUNITY): Payer: Self-pay

## 2023-06-18 ENCOUNTER — Other Ambulatory Visit (HOSPITAL_COMMUNITY): Payer: Self-pay

## 2023-07-26 ENCOUNTER — Other Ambulatory Visit (HOSPITAL_COMMUNITY): Payer: Self-pay

## 2023-07-27 ENCOUNTER — Other Ambulatory Visit (HOSPITAL_COMMUNITY): Payer: Self-pay

## 2023-07-27 MED ORDER — LISDEXAMFETAMINE DIMESYLATE 40 MG PO CAPS
40.0000 mg | ORAL_CAPSULE | Freq: Every morning | ORAL | 0 refills | Status: AC
  Filled 2023-07-27: qty 30, 30d supply, fill #0

## 2023-07-27 MED ORDER — BUSPIRONE HCL 5 MG PO TABS
5.0000 mg | ORAL_TABLET | Freq: Three times a day (TID) | ORAL | 2 refills | Status: DC
  Filled 2023-07-27: qty 90, 30d supply, fill #0

## 2023-07-27 MED ORDER — LISDEXAMFETAMINE DIMESYLATE 40 MG PO CAPS: 40.0000 mg | ORAL_CAPSULE | Freq: Every morning | ORAL | 0 refills | Status: AC

## 2023-07-27 MED ORDER — LISDEXAMFETAMINE DIMESYLATE 40 MG PO CAPS
40.0000 mg | ORAL_CAPSULE | Freq: Every morning | ORAL | 0 refills | Status: DC
  Filled 2023-12-16: qty 30, 30d supply, fill #0

## 2023-08-05 ENCOUNTER — Other Ambulatory Visit (HOSPITAL_COMMUNITY): Payer: Self-pay

## 2023-08-31 ENCOUNTER — Other Ambulatory Visit (HOSPITAL_COMMUNITY): Payer: Self-pay

## 2023-10-12 ENCOUNTER — Other Ambulatory Visit: Payer: Self-pay | Admitting: Family

## 2023-10-12 ENCOUNTER — Ambulatory Visit
Admission: RE | Admit: 2023-10-12 | Discharge: 2023-10-12 | Disposition: A | Discharge status: Home / Self Care | Source: Ambulatory Visit | Attending: Family | Admitting: Family

## 2023-10-12 DIAGNOSIS — M79671 Pain in right foot: Secondary | ICD-10-CM

## 2023-10-12 DIAGNOSIS — M79672 Pain in left foot: Secondary | ICD-10-CM

## 2023-10-26 ENCOUNTER — Other Ambulatory Visit (HOSPITAL_COMMUNITY): Payer: Self-pay

## 2023-10-26 MED ORDER — LISDEXAMFETAMINE DIMESYLATE 40 MG PO CAPS
40.0000 mg | ORAL_CAPSULE | Freq: Every morning | ORAL | 0 refills | Status: DC
  Filled 2023-10-26 – 2023-11-12 (×2): qty 30, 30d supply, fill #0

## 2023-11-04 ENCOUNTER — Other Ambulatory Visit (HOSPITAL_COMMUNITY): Payer: Self-pay

## 2023-11-12 ENCOUNTER — Other Ambulatory Visit (HOSPITAL_COMMUNITY): Payer: Self-pay

## 2023-12-16 ENCOUNTER — Other Ambulatory Visit (HOSPITAL_COMMUNITY): Payer: Self-pay

## 2024-02-04 ENCOUNTER — Other Ambulatory Visit (HOSPITAL_COMMUNITY): Payer: Self-pay

## 2024-02-07 ENCOUNTER — Other Ambulatory Visit: Payer: Self-pay

## 2024-02-07 ENCOUNTER — Other Ambulatory Visit (HOSPITAL_COMMUNITY): Payer: Self-pay

## 2024-02-07 MED ORDER — BUSPIRONE HCL 5 MG PO TABS
5.0000 mg | ORAL_TABLET | Freq: Three times a day (TID) | ORAL | 2 refills | Status: AC
  Filled 2024-02-07: qty 90, 30d supply, fill #0

## 2024-02-07 MED ORDER — LISDEXAMFETAMINE DIMESYLATE 40 MG PO CAPS
40.0000 mg | ORAL_CAPSULE | Freq: Every morning | ORAL | 0 refills | Status: AC
  Filled 2024-02-07 – 2024-03-03 (×2): qty 30, 30d supply, fill #0

## 2024-02-07 MED ORDER — LISDEXAMFETAMINE DIMESYLATE 40 MG PO CAPS: 40.0000 mg | ORAL_CAPSULE | Freq: Every morning | ORAL | 0 refills | Status: AC

## 2024-02-17 ENCOUNTER — Other Ambulatory Visit (HOSPITAL_COMMUNITY): Payer: Self-pay

## 2024-02-18 ENCOUNTER — Other Ambulatory Visit (HOSPITAL_COMMUNITY): Payer: Self-pay

## 2024-03-03 ENCOUNTER — Other Ambulatory Visit (HOSPITAL_COMMUNITY): Payer: Self-pay
